# Patient Record
Sex: Female | Born: 2000 | Race: White | Hispanic: No | Marital: Single | Smoking: Former smoker
Health system: Southern US, Community
[De-identification: ages and names within clinical notes are randomized; demographics above are authoritative.]

## PROBLEM LIST (undated history)

## (undated) DIAGNOSIS — J020 Streptococcal pharyngitis: Secondary | ICD-10-CM

## (undated) DIAGNOSIS — F32A Depression, unspecified: Secondary | ICD-10-CM

## (undated) DIAGNOSIS — F329 Major depressive disorder, single episode, unspecified: Secondary | ICD-10-CM

## (undated) HISTORY — PX: ADENOIDECTOMY: SUR15

## (undated) HISTORY — PX: TONSILLECTOMY: SUR1361

## (undated) HISTORY — DX: Depression, unspecified: F32.A

---
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
## 2022-01-25 DIAGNOSIS — Z419 Encounter for procedure for purposes other than remedying health state, unspecified: Secondary | ICD-10-CM | POA: Diagnosis not present

## 2022-02-25 DIAGNOSIS — Z419 Encounter for procedure for purposes other than remedying health state, unspecified: Secondary | ICD-10-CM | POA: Diagnosis not present

## 2022-03-28 DIAGNOSIS — Z419 Encounter for procedure for purposes other than remedying health state, unspecified: Secondary | ICD-10-CM | POA: Diagnosis not present

## 2022-04-27 DIAGNOSIS — Z419 Encounter for procedure for purposes other than remedying health state, unspecified: Secondary | ICD-10-CM | POA: Diagnosis not present

## 2022-05-28 DIAGNOSIS — Z419 Encounter for procedure for purposes other than remedying health state, unspecified: Secondary | ICD-10-CM | POA: Diagnosis not present

## 2022-06-27 DIAGNOSIS — Z419 Encounter for procedure for purposes other than remedying health state, unspecified: Secondary | ICD-10-CM | POA: Diagnosis not present

## 2022-07-26 IMAGING — US US ABDOMEN LIMITED RUQ/ASCITES
1 series · 14 of 25 positions shown · non-contrast
Comparison: None.

CLINICAL DATA: Right upper quadrant pain with nausea and vomiting
x1 day.

EXAM:
ULTRASOUND ABDOMEN LIMITED RIGHT UPPER QUADRANT

[Series 1: us abdomen limited ruq (liver/gb) · 14 of 74 slices shown]
[im 1/74]
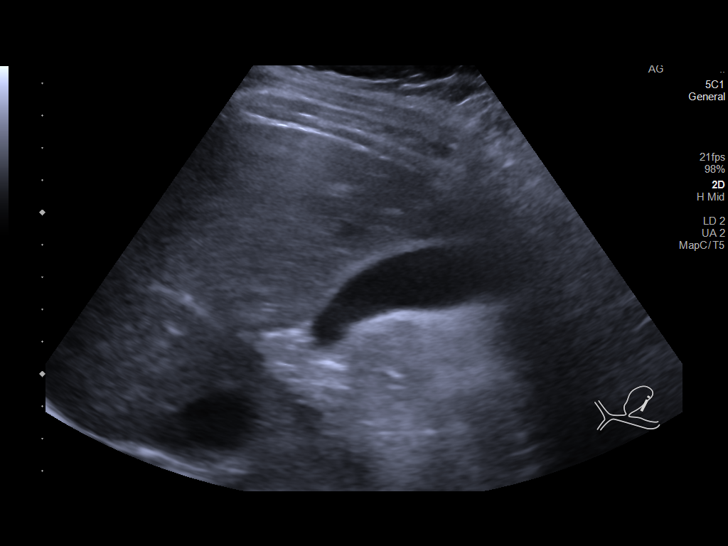
[im 7/74]
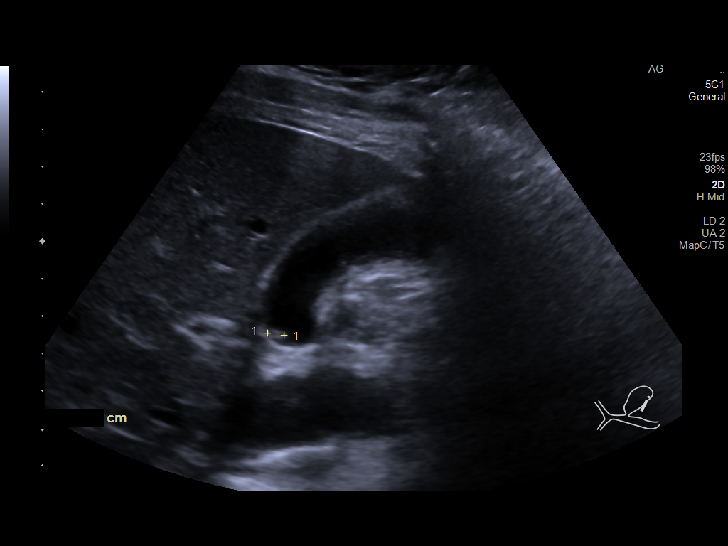
[im 13/74]
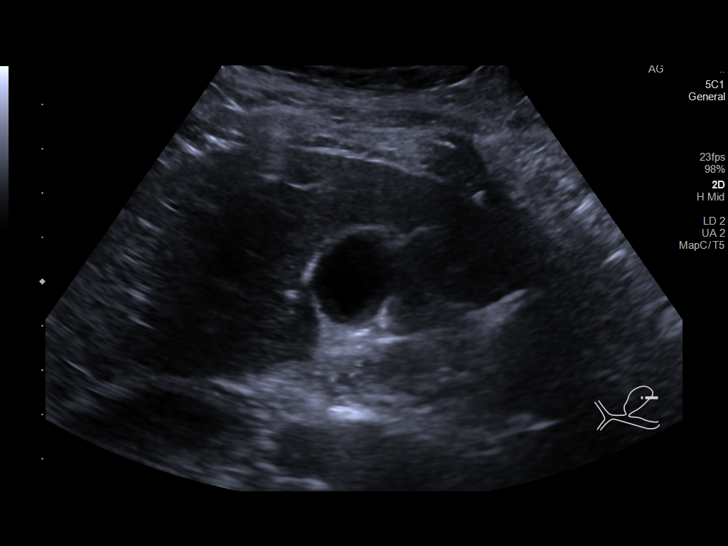
[im 19/74]
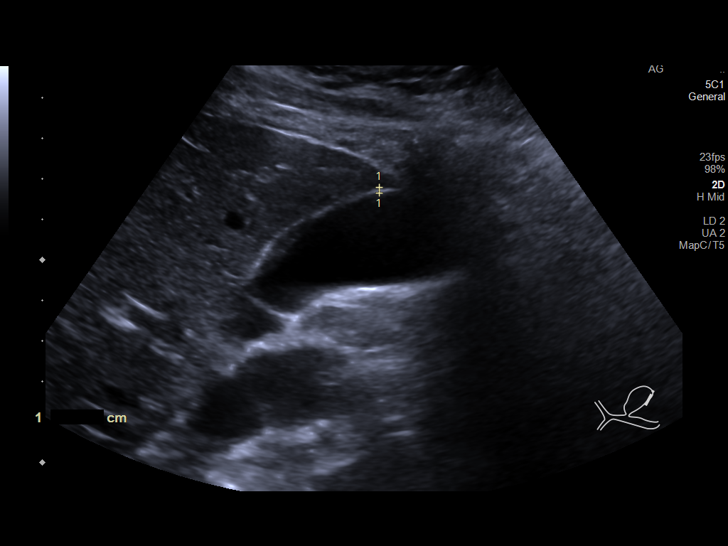
[im 25/74]
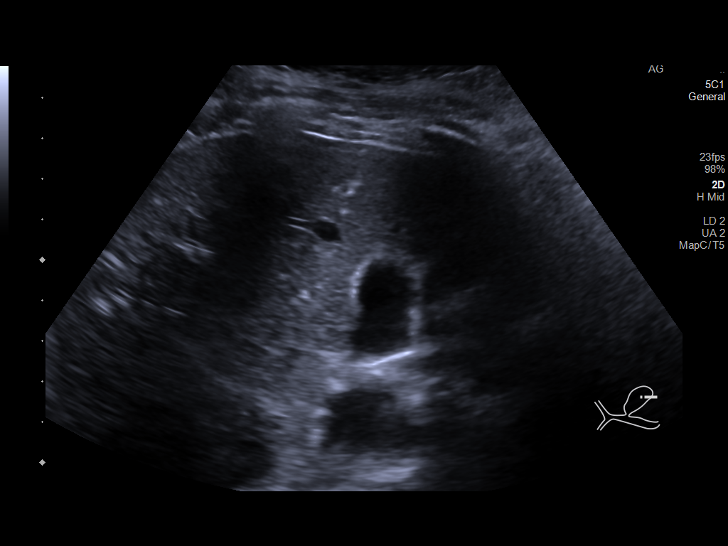
[im 28/74]
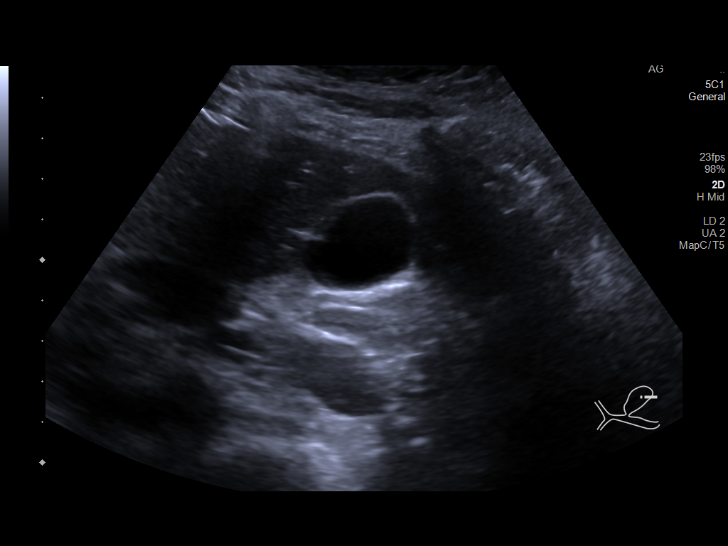
[im 34/74]
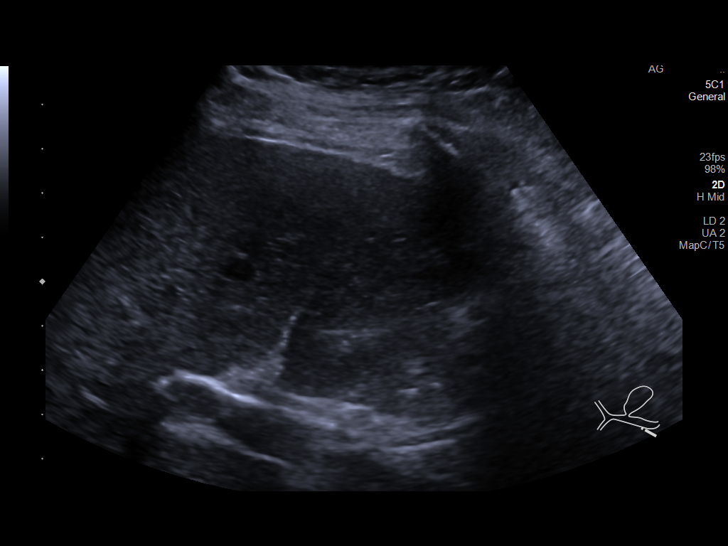
[im 40/74]
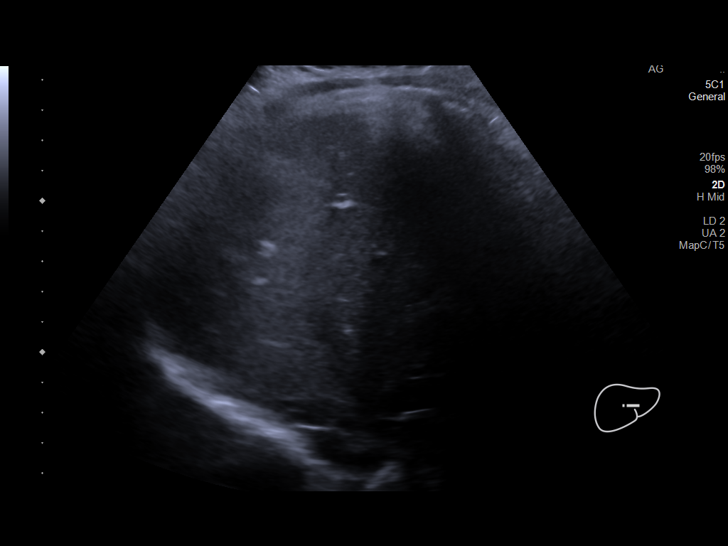
[im 46/74]
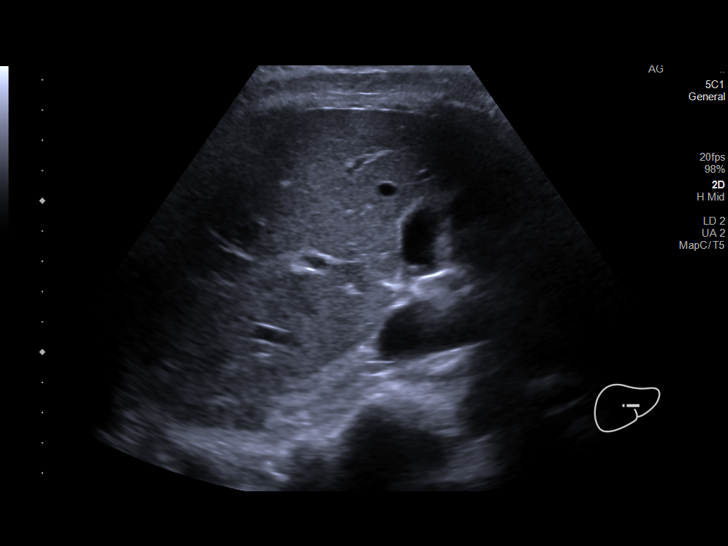
[im 49/74]
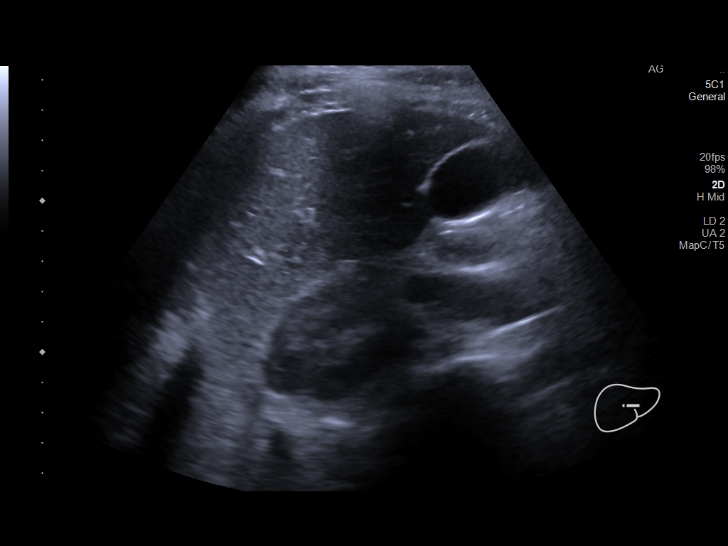
[im 55/74]
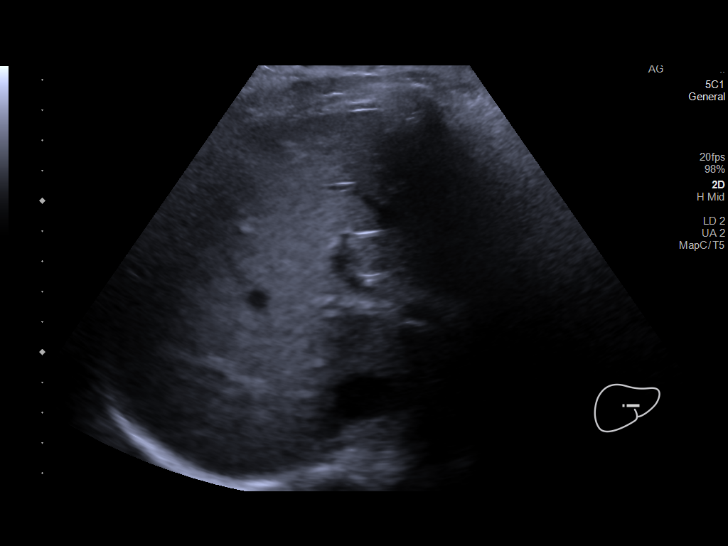
[im 61/74]
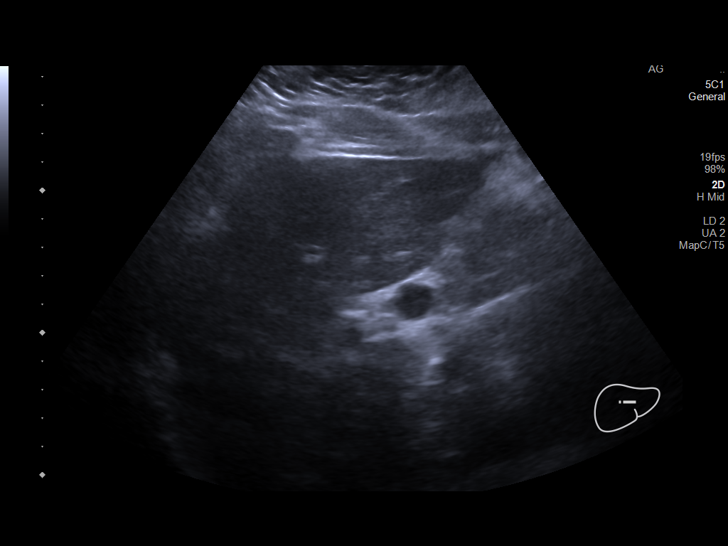
[im 67/74]
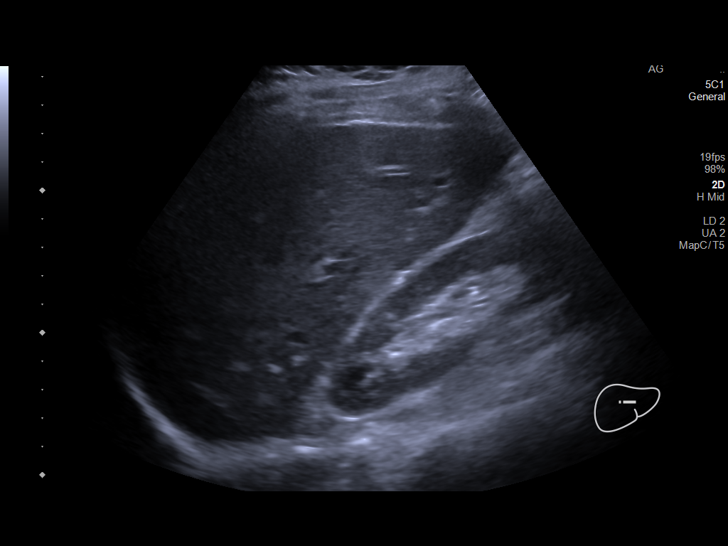
[im 74/74]
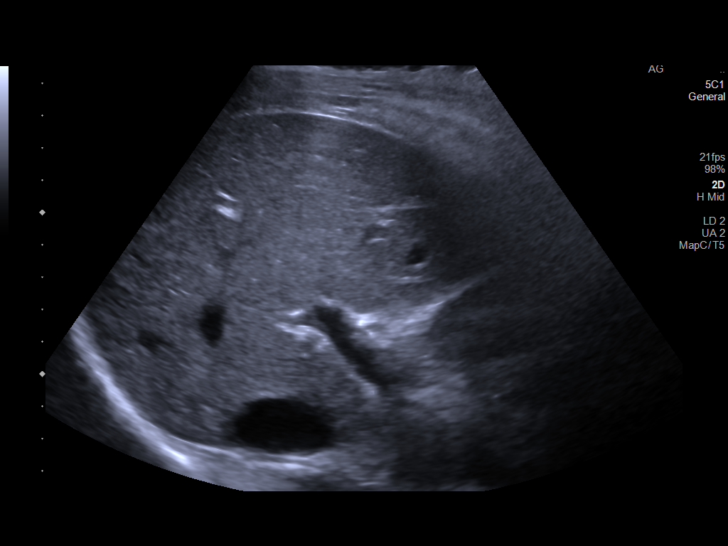

[14 of 25 positions shown; findings below may reference images not displayed]

FINDINGS: Gallbladder:

No gallstones or wall thickening visualized. No sonographic Murphy
sign noted by the sonographer.

-4 mm gallbladder polyp, no further evaluation or follow-up
necessary. Per consensus guidelines, this requires no additional
evaluation or specific follow-up. This recommendation follows ACR
consensus guidelines: White Paper of the ACR Incidental findings
Committee II on Gallbladder and Biliary Findings. [HOSPITAL]
2509:;[DATE].

Common bile duct:

Diameter: 2 mm

Liver:

No focal lesion identified. Within normal limits in parenchymal
echogenicity. Portal vein is patent on color Doppler imaging with
normal direction of blood flow towards the liver.

Other: None.
IMPRESSION: No acute sonographic abnormality in the right upper quadrant.

## 2022-07-28 DIAGNOSIS — Z419 Encounter for procedure for purposes other than remedying health state, unspecified: Secondary | ICD-10-CM | POA: Diagnosis not present

## 2022-08-28 DIAGNOSIS — Z419 Encounter for procedure for purposes other than remedying health state, unspecified: Secondary | ICD-10-CM | POA: Diagnosis not present

## 2022-09-26 DIAGNOSIS — Z419 Encounter for procedure for purposes other than remedying health state, unspecified: Secondary | ICD-10-CM | POA: Diagnosis not present

## 2022-10-27 DIAGNOSIS — Z419 Encounter for procedure for purposes other than remedying health state, unspecified: Secondary | ICD-10-CM | POA: Diagnosis not present

## 2022-11-26 DIAGNOSIS — Z419 Encounter for procedure for purposes other than remedying health state, unspecified: Secondary | ICD-10-CM | POA: Diagnosis not present

## 2022-12-27 DIAGNOSIS — Z419 Encounter for procedure for purposes other than remedying health state, unspecified: Secondary | ICD-10-CM | POA: Diagnosis not present

## 2023-01-26 DIAGNOSIS — Z419 Encounter for procedure for purposes other than remedying health state, unspecified: Secondary | ICD-10-CM | POA: Diagnosis not present

## 2023-02-26 DIAGNOSIS — Z419 Encounter for procedure for purposes other than remedying health state, unspecified: Secondary | ICD-10-CM | POA: Diagnosis not present

## 2023-03-29 DIAGNOSIS — Z419 Encounter for procedure for purposes other than remedying health state, unspecified: Secondary | ICD-10-CM | POA: Diagnosis not present

## 2023-04-28 DIAGNOSIS — Z419 Encounter for procedure for purposes other than remedying health state, unspecified: Secondary | ICD-10-CM | POA: Diagnosis not present

## 2023-05-29 DIAGNOSIS — Z419 Encounter for procedure for purposes other than remedying health state, unspecified: Secondary | ICD-10-CM | POA: Diagnosis not present

## 2023-06-10 DIAGNOSIS — R509 Fever, unspecified: Secondary | ICD-10-CM | POA: Diagnosis not present

## 2023-06-10 DIAGNOSIS — E860 Dehydration: Secondary | ICD-10-CM | POA: Diagnosis not present

## 2023-06-10 DIAGNOSIS — L03011 Cellulitis of right finger: Secondary | ICD-10-CM | POA: Diagnosis not present

## 2023-06-10 DIAGNOSIS — N390 Urinary tract infection, site not specified: Secondary | ICD-10-CM | POA: Diagnosis not present

## 2023-06-10 DIAGNOSIS — Z20822 Contact with and (suspected) exposure to covid-19: Secondary | ICD-10-CM | POA: Diagnosis not present

## 2023-06-28 DIAGNOSIS — Z419 Encounter for procedure for purposes other than remedying health state, unspecified: Secondary | ICD-10-CM | POA: Diagnosis not present

## 2023-07-29 DIAGNOSIS — Z419 Encounter for procedure for purposes other than remedying health state, unspecified: Secondary | ICD-10-CM | POA: Diagnosis not present

## 2023-08-27 NOTE — Progress Notes (Deleted)
 New Patient Office Visit  Subjective   Patient ID: Tanya Maldonado, female    DOB: 2001/02/23  Age: 23 y.o. MRN: [id number]  CC: No chief complaint on file.   HPI Tanya Maldonado presents to establish care ***  Outpatient Encounter Medications as of 08/31/2023  Medication Sig   norelgestromin-ethinyl estradiol (ORTHO EVRA) 150-35 MCG/24HR transdermal patch Place 1 patch onto the skin once a week.   ondansetron (ZOFRAN) 4 MG tablet Take 1 tablet (4 mg total) by mouth every 6 (six) hours.   prochlorperazine (COMPAZINE) 10 MG tablet Take 1 tablet (10 mg total) by mouth 2 (two) times daily as needed for nausea or vomiting.   No facility-administered encounter medications on file as of 08/31/2023.    Past Medical History:  Diagnosis Date   Depression    Strep pharyngitis     Past Surgical History:  Procedure Laterality Date   ADENOIDECTOMY     TONSILLECTOMY      Family History  Problem Relation Age of Onset   Heart attack Maternal Grandfather    ADD / ADHD Brother    Bipolar disorder Sister    Depression Sister     Social History   Socioeconomic History   Marital status: Single    Spouse name: Not on file   Number of children: Not on file   Years of education: Not on file   Highest education level: Not on file  Occupational History   Not on file  Tobacco Use   Smoking status: Former    Current packs/day: 0.00    Types: Cigarettes    Start date: 02/19/2019    Quit date: 12/08/2019    Years since quitting: 3.7   Smokeless tobacco: Never  Vaping Use   Vaping status: Former   Quit date: 01/06/2020   Substances: Nicotine  Substance and Sexual Activity   Alcohol use: Never   Drug use: Never   Sexual activity: Yes    Birth control/protection: Condom, None  Other Topics Concern   Not on file  Social History Narrative   Not on file   Social Drivers of Health   Financial Resource Strain: Not on file  Food Insecurity: Not on file  Transportation Needs: Not  on file  Physical Activity: Not on file  Stress: Not on file  Social Connections: Not on file  Intimate Partner Violence: Not on file    ROS Negative unless indicated in HPI    Objective   There were no vitals taken for this visit.  Physical Exam  Last CBC Lab Results  Component Value Date   WBC 10.4 11/20/2020   HGB 13.5 11/20/2020   HCT 39.5 11/20/2020   MCV 93.4 11/20/2020   MCH 31.9 11/20/2020   RDW 12.6 11/20/2020   PLT 392 11/20/2020   Last metabolic panel Lab Results  Component Value Date   GLUCOSE 135 (H) 11/20/2020   NA 132 (L) 11/20/2020   K 3.3 (L) 11/20/2020   CL 103 11/20/2020   CO2 16 (L) 11/20/2020   BUN 8 11/20/2020   CREATININE 0.78 11/20/2020   GFRNONAA NOT CALCULATED 11/20/2020   CALCIUM 9.6 11/20/2020   PROT 8.0 11/20/2020   ALBUMIN 4.8 11/20/2020   BILITOT 1.1 11/20/2020   ALKPHOS 49 11/20/2020   AST 32 11/20/2020   ALT 22 11/20/2020   ANIONGAP 13 11/20/2020         Assessment & Plan:  There are no diagnoses linked to this encounter. Continue healthy lifestyle choices,  including diet (rich in fruits, vegetables, and lean proteins, and low in salt and simple carbohydrates) and exercise (at least 30 minutes of moderate physical activity daily).     The above assessment and management plan was discussed with the patient. The patient verbalized understanding of and has agreed to the management plan. Patient is aware to call the clinic if they develop any new symptoms or if symptoms persist or worsen. Patient is aware when to return to the clinic for a follow-up visit. Patient educated on when it is appropriate to go to the emergency department.  No follow-ups on file.   Arrie Aran Santa Lighter, [facility] Medicine [address] [place], [place] [address] [phone]  Note: This document was prepared by [employer]ubin Milan voice dictation technology and any errors that results from this process are unintentional.

## 2023-08-29 DIAGNOSIS — Z419 Encounter for procedure for purposes other than remedying health state, unspecified: Secondary | ICD-10-CM | POA: Diagnosis not present

## 2023-08-31 ENCOUNTER — Ambulatory Visit: Payer: Medicaid Other | Admitting: Nurse Practitioner

## 2023-09-24 ENCOUNTER — Emergency Department (HOSPITAL_COMMUNITY)
Admission: EM | Admit: 2023-09-24 | Discharge: 2023-09-25 | Discharge status: Against Medical Advice | Payer: Medicaid Other | Attending: Emergency Medicine | Admitting: Emergency Medicine

## 2023-09-24 ENCOUNTER — Other Ambulatory Visit: Payer: Self-pay

## 2023-09-24 DIAGNOSIS — Z5321 Procedure and treatment not carried out due to patient leaving prior to being seen by health care provider: Secondary | ICD-10-CM | POA: Insufficient documentation

## 2023-09-24 DIAGNOSIS — T7421XA Adult sexual abuse, confirmed, initial encounter: Secondary | ICD-10-CM | POA: Diagnosis not present

## 2023-09-24 DIAGNOSIS — R109 Unspecified abdominal pain: Secondary | ICD-10-CM | POA: Diagnosis not present

## 2023-09-24 DIAGNOSIS — R112 Nausea with vomiting, unspecified: Secondary | ICD-10-CM | POA: Diagnosis not present

## 2023-09-24 DIAGNOSIS — Z743 Need for continuous supervision: Secondary | ICD-10-CM | POA: Diagnosis not present

## 2023-09-24 DIAGNOSIS — M549 Dorsalgia, unspecified: Secondary | ICD-10-CM | POA: Diagnosis not present

## 2023-09-24 DIAGNOSIS — R1030 Lower abdominal pain, unspecified: Secondary | ICD-10-CM | POA: Diagnosis not present

## 2023-09-24 DIAGNOSIS — R531 Weakness: Secondary | ICD-10-CM | POA: Diagnosis not present

## 2023-09-24 LAB — CBC WITH DIFFERENTIAL/PLATELET
Abs Immature Granulocytes: 0.09 10*3/uL — ABNORMAL HIGH (ref 0.00–0.07)
Basophils Absolute: 0 10*3/uL (ref 0.0–0.1)
Basophils Relative: 0 %
Eosinophils Absolute: 0 10*3/uL (ref 0.0–0.5)
Eosinophils Relative: 0 %
HCT: 44.6 % (ref 36.0–46.0)
Hemoglobin: 15 g/dL (ref 12.0–15.0)
Immature Granulocytes: 1 %
Lymphocytes Relative: 4 %
Lymphs Abs: 0.8 10*3/uL (ref 0.7–4.0)
MCH: 31.6 pg (ref 26.0–34.0)
MCHC: 33.6 g/dL (ref 30.0–36.0)
MCV: 94.1 fL (ref 80.0–100.0)
Monocytes Absolute: 0.6 10*3/uL (ref 0.1–1.0)
Monocytes Relative: 3 %
Neutro Abs: 17.7 10*3/uL — ABNORMAL HIGH (ref 1.7–7.7)
Neutrophils Relative %: 92 %
Platelets: 468 10*3/uL — ABNORMAL HIGH (ref 150–400)
RBC: 4.74 MIL/uL (ref 3.87–5.11)
RDW: 13.2 % (ref 11.5–15.5)
WBC: 19.1 10*3/uL — ABNORMAL HIGH (ref 4.0–10.5)
nRBC: 0 % (ref 0.0–0.2)

## 2023-09-24 LAB — URINALYSIS, ROUTINE W REFLEX MICROSCOPIC
Bilirubin Urine: NEGATIVE
Glucose, UA: NEGATIVE mg/dL
Hgb urine dipstick: NEGATIVE
Ketones, ur: NEGATIVE mg/dL
Leukocytes,Ua: NEGATIVE
Nitrite: NEGATIVE
Protein, ur: 30 mg/dL — AB
Specific Gravity, Urine: 1.025 (ref 1.005–1.030)
pH: 6 (ref 5.0–8.0)

## 2023-09-24 LAB — COMPREHENSIVE METABOLIC PANEL
ALT: 18 U/L (ref 0–44)
AST: 24 U/L (ref 15–41)
Albumin: 3.8 g/dL (ref 3.5–5.0)
Alkaline Phosphatase: 73 U/L (ref 38–126)
Anion gap: 12 (ref 5–15)
BUN: 13 mg/dL (ref 6–20)
CO2: 21 mmol/L — ABNORMAL LOW (ref 22–32)
Calcium: 9.1 mg/dL (ref 8.9–10.3)
Chloride: 101 mmol/L (ref 98–111)
Creatinine, Ser: 0.81 mg/dL (ref 0.44–1.00)
GFR, Estimated: >60 mL/min (ref >60)
Glucose, Bld: 151 mg/dL — ABNORMAL HIGH (ref 70–99)
Potassium: 4.2 mmol/L (ref 3.5–5.1)
Sodium: 134 mmol/L — ABNORMAL LOW (ref 135–145)
Total Bilirubin: 0.9 mg/dL (ref 0.0–1.2)
Total Protein: 7.5 g/dL (ref 6.5–8.1)

## 2023-09-24 LAB — PREGNANCY, URINE: Preg Test, Ur: NEGATIVE

## 2023-09-24 LAB — LIPASE, BLOOD: Lipase: 20 U/L (ref 11–51)
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
## 2023-09-24 NOTE — ED Notes (Signed)
 Mother called and checked on patient. Informed boyfriend was on his way here.

## 2023-09-25 LAB — GC/CHLAMYDIA PROBE AMP (~~LOC~~) NOT AT ARMC
Chlamydia: NEGATIVE
Comment: NEGATIVE
Comment: NORMAL
Neisseria Gonorrhea: NEGATIVE

## 2023-09-25 LAB — HIV ANTIBODY (ROUTINE TESTING W REFLEX): HIV Screen 4th Generation wRfx: NONREACTIVE

## 2023-09-26 DIAGNOSIS — Z419 Encounter for procedure for purposes other than remedying health state, unspecified: Secondary | ICD-10-CM | POA: Diagnosis not present

## 2023-11-07 DIAGNOSIS — Z419 Encounter for procedure for purposes other than remedying health state, unspecified: Secondary | ICD-10-CM | POA: Diagnosis not present

## 2023-12-07 DIAGNOSIS — Z419 Encounter for procedure for purposes other than remedying health state, unspecified: Secondary | ICD-10-CM | POA: Diagnosis not present

## 2024-01-07 DIAGNOSIS — Z419 Encounter for procedure for purposes other than remedying health state, unspecified: Secondary | ICD-10-CM | POA: Diagnosis not present

## 2024-02-06 ENCOUNTER — Other Ambulatory Visit: Payer: Self-pay

## 2024-02-06 ENCOUNTER — Encounter (HOSPITAL_COMMUNITY): Payer: Self-pay

## 2024-02-06 ENCOUNTER — Emergency Department (HOSPITAL_COMMUNITY)

## 2024-02-06 ENCOUNTER — Emergency Department (HOSPITAL_COMMUNITY)
Admission: EM | Admit: 2024-02-06 | Discharge: 2024-02-06 | Attending: Emergency Medicine | Admitting: Emergency Medicine

## 2024-02-06 DIAGNOSIS — Z419 Encounter for procedure for purposes other than remedying health state, unspecified: Secondary | ICD-10-CM | POA: Diagnosis not present

## 2024-02-06 DIAGNOSIS — Z91010 Allergy to peanuts: Secondary | ICD-10-CM | POA: Insufficient documentation

## 2024-02-06 DIAGNOSIS — F17210 Nicotine dependence, cigarettes, uncomplicated: Secondary | ICD-10-CM | POA: Insufficient documentation

## 2024-02-06 DIAGNOSIS — Z743 Need for continuous supervision: Secondary | ICD-10-CM | POA: Diagnosis not present

## 2024-02-06 DIAGNOSIS — R000 Tachycardia, unspecified: Secondary | ICD-10-CM | POA: Diagnosis not present

## 2024-02-06 DIAGNOSIS — T7840XA Allergy, unspecified, initial encounter: Secondary | ICD-10-CM | POA: Diagnosis not present

## 2024-02-06 LAB — URINALYSIS, ROUTINE W REFLEX MICROSCOPIC
Bilirubin Urine: NEGATIVE
Glucose, UA: NEGATIVE mg/dL
Hgb urine dipstick: NEGATIVE
Ketones, ur: NEGATIVE mg/dL
Nitrite: POSITIVE — AB
Protein, ur: NEGATIVE mg/dL
Specific Gravity, Urine: 1.006 (ref 1.005–1.030)
pH: 7 (ref 5.0–8.0)

## 2024-02-06 MED ORDER — FAMOTIDINE IN NACL 20-0.9 MG/50ML-% IV SOLN
20.0000 mg | Freq: Once | INTRAVENOUS | Status: AC
  Administered 2024-02-06: 20 mg via INTRAVENOUS
  Filled 2024-02-06: qty 50

## 2024-02-06 MED ORDER — CETIRIZINE HCL 10 MG PO TABS: 10.0000 mg | ORAL_TABLET | Freq: Every day | ORAL | 0 refills | Status: AC

## 2024-02-06 MED ORDER — DIPHENHYDRAMINE HCL 25 MG PO CAPS
50.0000 mg | ORAL_CAPSULE | Freq: Once | ORAL | Status: AC
  Administered 2024-02-06: 50 mg via ORAL
  Filled 2024-02-06: qty 2

## 2024-02-06 MED ORDER — EPINEPHRINE 0.3 MG/0.3ML IJ SOAJ: 0.3000 mg | INTRAMUSCULAR | 0 refills | Status: AC | PRN

## 2024-02-06 MED ORDER — SODIUM CHLORIDE 0.9 % IV BOLUS
1000.0000 mL | Freq: Once | INTRAVENOUS | Status: AC
  Administered 2024-02-06: 1000 mL via INTRAVENOUS

## 2024-02-06 MED ORDER — METHYLPREDNISOLONE SODIUM SUCC 125 MG IJ SOLR
125.0000 mg | Freq: Once | INTRAMUSCULAR | Status: AC
  Administered 2024-02-06: 125 mg via INTRAVENOUS
  Filled 2024-02-06: qty 2
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
## 2024-02-06 NOTE — Discharge Instructions (Addendum)
 It was a pleasure caring for you today in the emergency department.  Recommend you avoid known allergens in the future.  If you do experience symptoms of severe allergic reaction please administer EpiPen  and call EMS.  Take antihistamine daily for the next 2 weeks. Please follow up with your PCP  Please return to the emergency department for any worsening or worrisome symptoms.
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
## 2024-02-06 NOTE — ED Notes (Signed)
 X-ray at bedside

## 2024-02-06 NOTE — ED Notes (Signed)
 [facility] to notify us  that pt had taken 1000mg  Tylenol  and 20 mg Omeprazole this am before coming to ED along with the Epipen .
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
## 2024-02-10 ENCOUNTER — Emergency Department (HOSPITAL_COMMUNITY)
Admission: EM | Admit: 2024-02-10 | Discharge: 2024-02-10 | Discharge status: Against Medical Advice | Attending: Student | Admitting: Student

## 2024-02-10 ENCOUNTER — Other Ambulatory Visit: Payer: Self-pay

## 2024-02-10 ENCOUNTER — Encounter (HOSPITAL_COMMUNITY): Payer: Self-pay | Admitting: *Deleted

## 2024-02-10 DIAGNOSIS — Z91010 Allergy to peanuts: Secondary | ICD-10-CM | POA: Diagnosis not present

## 2024-02-10 DIAGNOSIS — Z743 Need for continuous supervision: Secondary | ICD-10-CM | POA: Diagnosis not present

## 2024-02-10 DIAGNOSIS — R110 Nausea: Secondary | ICD-10-CM | POA: Diagnosis not present

## 2024-02-10 DIAGNOSIS — L03111 Cellulitis of right axilla: Secondary | ICD-10-CM

## 2024-02-10 DIAGNOSIS — T7840XA Allergy, unspecified, initial encounter: Secondary | ICD-10-CM | POA: Insufficient documentation

## 2024-02-10 DIAGNOSIS — R609 Edema, unspecified: Secondary | ICD-10-CM | POA: Diagnosis not present

## 2024-02-10 DIAGNOSIS — R000 Tachycardia, unspecified: Secondary | ICD-10-CM | POA: Diagnosis not present

## 2024-02-10 LAB — CBC WITH DIFFERENTIAL/PLATELET
Abs Immature Granulocytes: 0.03 K/uL (ref 0.00–0.07)
Basophils Absolute: 0 K/uL (ref 0.0–0.1)
Basophils Relative: 0 %
Eosinophils Absolute: 0.1 K/uL (ref 0.0–0.5)
Eosinophils Relative: 1 %
HCT: 41 % (ref 36.0–46.0)
Hemoglobin: 13.3 g/dL (ref 12.0–15.0)
Immature Granulocytes: 0 %
Lymphocytes Relative: 17 %
Lymphs Abs: 1.6 K/uL (ref 0.7–4.0)
MCH: 30.8 pg (ref 26.0–34.0)
MCHC: 32.4 g/dL (ref 30.0–36.0)
MCV: 94.9 fL (ref 80.0–100.0)
Monocytes Absolute: 0.3 K/uL (ref 0.1–1.0)
Monocytes Relative: 3 %
Neutro Abs: 7.3 K/uL (ref 1.7–7.7)
Neutrophils Relative %: 79 %
Platelets: 364 K/uL (ref 150–400)
RBC: 4.32 MIL/uL (ref 3.87–5.11)
RDW: 12.2 % (ref 11.5–15.5)
WBC: 9.4 K/uL (ref 4.0–10.5)
nRBC: 0 % (ref 0.0–0.2)

## 2024-02-10 LAB — COMPREHENSIVE METABOLIC PANEL WITH GFR
ALT: 15 U/L (ref 0–44)
AST: 11 U/L — ABNORMAL LOW (ref 15–41)
Albumin: 3.5 g/dL (ref 3.5–5.0)
Alkaline Phosphatase: 71 U/L (ref 38–126)
Anion gap: 9 (ref 5–15)
BUN: 13 mg/dL (ref 6–20)
CO2: 23 mmol/L (ref 22–32)
Calcium: 8.3 mg/dL — ABNORMAL LOW (ref 8.9–10.3)
Chloride: 107 mmol/L (ref 98–111)
Creatinine, Ser: 0.67 mg/dL (ref 0.44–1.00)
GFR, Estimated: >60 mL/min (ref >60)
Glucose, Bld: 107 mg/dL — ABNORMAL HIGH (ref 70–99)
Potassium: 4 mmol/L (ref 3.5–5.1)
Sodium: 139 mmol/L (ref 135–145)
Total Bilirubin: 0.3 mg/dL (ref 0.0–1.2)
Total Protein: 6.5 g/dL (ref 6.5–8.1)

## 2024-02-10 LAB — URINALYSIS, ROUTINE W REFLEX MICROSCOPIC
Bilirubin Urine: NEGATIVE
Glucose, UA: NEGATIVE mg/dL
Hgb urine dipstick: NEGATIVE
Ketones, ur: NEGATIVE mg/dL
Nitrite: NEGATIVE
Protein, ur: NEGATIVE mg/dL
Specific Gravity, Urine: 1.008 (ref 1.005–1.030)
pH: 7 (ref 5.0–8.0)

## 2024-02-10 LAB — PREGNANCY, URINE: Preg Test, Ur: NEGATIVE

## 2024-02-10 LAB — LIPASE, BLOOD: Lipase: 34 U/L (ref 11–51)

## 2024-02-10 MED ORDER — CEPHALEXIN 500 MG PO CAPS
500.0000 mg | ORAL_CAPSULE | Freq: Four times a day (QID) | ORAL | 0 refills | Status: AC
Start: 2024-02-10 — End: 2024-02-20

## 2024-02-10 MED ORDER — DOXYCYCLINE HYCLATE 100 MG PO TABS
100.0000 mg | ORAL_TABLET | Freq: Once | ORAL | Status: AC
  Administered 2024-02-10: 100 mg via ORAL
  Filled 2024-02-10: qty 1

## 2024-02-10 MED ORDER — DOXYCYCLINE HYCLATE 100 MG PO CAPS: 100.0000 mg | ORAL_CAPSULE | Freq: Two times a day (BID) | ORAL | 0 refills | Status: DC

## 2024-02-10 MED ORDER — FAMOTIDINE IN NACL 20-0.9 MG/50ML-% IV SOLN
20.0000 mg | Freq: Once | INTRAVENOUS | Status: AC
  Administered 2024-02-10: 20 mg via INTRAVENOUS
  Filled 2024-02-10: qty 50

## 2024-02-10 MED ORDER — SODIUM CHLORIDE 0.9 % IV BOLUS
1000.0000 mL | Freq: Once | INTRAVENOUS | Status: AC
  Administered 2024-02-10: 1000 mL via INTRAVENOUS

## 2024-02-10 MED ORDER — KETOROLAC TROMETHAMINE 15 MG/ML IJ SOLN
15.0000 mg | Freq: Once | INTRAMUSCULAR | Status: AC
  Administered 2024-02-10: 15 mg via INTRAVENOUS
  Filled 2024-02-10: qty 1

## 2024-02-10 NOTE — ED Notes (Addendum)
 Staff member removed pt's IV, reported that pt left.  I had explained to pt earlier that she had not been discharged yet( pt was just released from custody with [facility]) .  EDP informed of pt's leaving.

## 2024-02-10 NOTE — ED Triage Notes (Addendum)
 Pt BIB [facility] for allergic reaction, pt ate some peanut butter today. Pt is allergic to peanuts.  Reported that pt did the same on Saturday.  Pt is inmate at [facility] jail with guards at bedside. Reported that peanut butter was found in her room.  Reported that epi was given at the jail, EMS est IV and benadryl  50mg , solumedrol 125 mg and zofran  given en route.   EPI given at 1055 to right thigh.

## 2024-02-10 NOTE — ED Provider Notes (Signed)
 [place] EMERGENCY DEPARTMENT AT [facility] Provider Note   CSN: [id number] Arrival date & time: 02/10/24  1138     Patient presents with: Allergic Reaction   Tanya Maldonado is a 23 y.o. female.   Patient is a 23 year old female who is currently incarcerated who presents emergency department with a chief complaint of allergic reaction.  She notes that she was exposed to peanuts which she is allergic to.  She notes that she began developing shortness of breath, nausea and swelling.  She was given epinephrine  as well as 50 of Benadryl , 125 Solu-Medrol  and Zofran  and route.  She notes that she is feeling improved at this time.  Patient notes that she has been experiencing chronic ongoing abdominal pain as well as associated constipation.  She denies any fever, chills, chest pain, shortness of breath.  She also notes that she does have an area of swelling just under the right axilla.   Allergic Reaction Presenting symptoms: rash        Prior to Admission medications   Medication Sig Start Date End Date Taking? Authorizing Provider  cetirizine  (ZYRTEC  ALLERGY) 10 MG tablet Take 1 tablet (10 mg total) by mouth daily for 14 days. 02/06/24 02/20/24  Elnor Savant A, DO  EPINEPHrine  0.3 mg/0.3 mL IJ SOAJ injection Inject 0.3 mg into the muscle as needed for anaphylaxis. 02/06/24   Elnor Savant LABOR, DO  GOODSENSE PAIN RELIEF EXTRA ST 500 MG tablet Take 500 mg by mouth every 6 (six) hours as needed for mild pain (pain score 1-3). 02/01/24   [provider]  norelgestromin -ethinyl estradiol  (ORTHO EVRA) 150-35 MCG/24HR transdermal patch Place 1 patch onto the skin once a week. 01/17/19   Signa Delon LABOR, NP  omeprazole (PRILOSEC) 40 MG capsule Take 40 mg by mouth daily. 08/13/21   [provider]  ondansetron  (ZOFRAN ) 4 MG tablet Take 1 tablet (4 mg total) by mouth every 6 (six) hours. 01/07/20   Cleotilde Rogue, MD  prochlorperazine  (COMPAZINE ) 10 MG tablet Take 1 tablet  (10 mg total) by mouth 2 (two) times daily as needed for nausea or vomiting. 11/20/20   Idol, Julie, PA-C    Allergies: Other and Peanut-containing drug products    Review of Systems  Gastrointestinal:  Positive for abdominal pain.  Skin:  Positive for rash.  All other systems reviewed and are negative.   Updated Vital Signs BP 123/81   Pulse 95   Temp 98.2 F (36.8 C) (Oral)   Resp (!) 23   Ht 5' 3 (1.6 m)   Wt 61.7 kg   SpO2 100%   BMI 24.10 kg/m   Physical Exam Vitals and nursing note reviewed.  Constitutional:      General: She is not in acute distress.    Appearance: Normal appearance. She is not ill-appearing.  HENT:     Head: Normocephalic and atraumatic.     Nose: Nose normal.     Mouth/Throat:     Mouth: Mucous membranes are moist.  Eyes:     Extraocular Movements: Extraocular movements intact.     Conjunctiva/sclera: Conjunctivae normal.     Pupils: Pupils are equal, round, and reactive to light.  Cardiovascular:     Rate and Rhythm: Normal rate and regular rhythm.     Pulses: Normal pulses.     Heart sounds: Normal heart sounds. No murmur heard.    No gallop.  Pulmonary:     Effort: Pulmonary effort is normal. No respiratory distress.  Breath sounds: Normal breath sounds. No stridor. No wheezing, rhonchi or rales.  Abdominal:     General: Abdomen is flat. Bowel sounds are normal. There is no distension.     Palpations: Abdomen is soft.     Tenderness: There is abdominal tenderness. There is no guarding.  Musculoskeletal:        General: Normal range of motion.     Cervical back: Normal range of motion and neck supple. No rigidity or tenderness.     Right lower leg: No edema.     Left lower leg: No edema.  Skin:    General: Skin is warm and dry.     Comments: Localized area of swelling to the right axilla, no areas of fluctuance, no active discharge, no lymphatic streaking  Neurological:     General: No focal deficit present.     Mental Status:  She is alert and oriented to person, place, and time. Mental status is at baseline.  Psychiatric:        Mood and Affect: Mood normal.        Behavior: Behavior normal.        Thought Content: Thought content normal.        Judgment: Judgment normal.     (all labs ordered are listed, but only abnormal results are displayed) Labs Reviewed  COMPREHENSIVE METABOLIC PANEL WITH GFR  LIPASE, BLOOD  CBC WITH DIFFERENTIAL/PLATELET  URINALYSIS, ROUTINE W REFLEX MICROSCOPIC  PREGNANCY, URINE    EKG: None  Radiology: No results found.   Procedures   Medications Ordered in the ED  famotidine  (PEPCID ) IVPB 20 mg premix (20 mg Intravenous New Bag/Given 02/10/24 1218)  ketorolac  (TORADOL ) 15 MG/ML injection 15 mg (has no administration in time range)  doxycycline  (VIBRA -TABS) tablet 100 mg (has no administration in time range)  sodium chloride  0.9 % bolus 1,000 mL (1,000 mLs Intravenous New Bag/Given 02/10/24 1217)                                    Medical Decision Making Patient did leave the emergency department before all workup was finished.  I was unable to reevaluate the patient before that time.  She was otherwise well-appearing before leaving with no signs of anaphylaxis or acute allergic process.  Urinalysis is concerning for urinary tract infection and she did have an area of possible cellulitis to her right axilla.  Will still call in antibiotics.  Patient was released from custody and then left the emergency department shortly thereafter.  Again patient was stable on my initial and secondary evaluation.  Unable to discuss the risks with the patient regarding leaving AGAINST MEDICAL ADVICE.  Unable to contact the patient.  Amount and/or Complexity of Data Reviewed Labs: ordered.  Risk Prescription drug management.        Final diagnoses:  None    ED Discharge Orders     None          Daralene Lonni JONETTA DEVONNA 02/10/24 1425    Kommor, [place],  MD 02/11/24 [phone]

## 2024-02-26 DIAGNOSIS — F17210 Nicotine dependence, cigarettes, uncomplicated: Secondary | ICD-10-CM | POA: Diagnosis not present

## 2024-02-26 DIAGNOSIS — W268XXA Contact with other sharp object(s), not elsewhere classified, initial encounter: Secondary | ICD-10-CM | POA: Diagnosis not present

## 2024-02-26 DIAGNOSIS — L03012 Cellulitis of left finger: Secondary | ICD-10-CM | POA: Diagnosis not present

## 2024-02-26 DIAGNOSIS — M7989 Other specified soft tissue disorders: Secondary | ICD-10-CM | POA: Diagnosis not present

## 2024-03-08 DIAGNOSIS — Z419 Encounter for procedure for purposes other than remedying health state, unspecified: Secondary | ICD-10-CM | POA: Diagnosis not present

## 2024-04-08 DIAGNOSIS — Z419 Encounter for procedure for purposes other than remedying health state, unspecified: Secondary | ICD-10-CM | POA: Diagnosis not present

## 2024-06-04 ENCOUNTER — Emergency Department (HOSPITAL_COMMUNITY)

## 2024-06-04 ENCOUNTER — Other Ambulatory Visit: Payer: Self-pay

## 2024-06-04 ENCOUNTER — Inpatient Hospital Stay (HOSPITAL_COMMUNITY)
Admission: EM | Admit: 2024-06-04 | Discharge: 2024-06-09 | DRG: 897 | Attending: Internal Medicine | Admitting: Internal Medicine

## 2024-06-04 ENCOUNTER — Encounter (HOSPITAL_COMMUNITY): Payer: Self-pay | Admitting: Emergency Medicine

## 2024-06-04 DIAGNOSIS — F1010 Alcohol abuse, uncomplicated: Secondary | ICD-10-CM | POA: Diagnosis not present

## 2024-06-04 DIAGNOSIS — Z889 Allergy status to unspecified drugs, medicaments and biological substances status: Secondary | ICD-10-CM

## 2024-06-04 DIAGNOSIS — R000 Tachycardia, unspecified: Secondary | ICD-10-CM | POA: Diagnosis not present

## 2024-06-04 DIAGNOSIS — G9389 Other specified disorders of brain: Secondary | ICD-10-CM | POA: Diagnosis present

## 2024-06-04 DIAGNOSIS — Z91010 Allergy to peanuts: Secondary | ICD-10-CM

## 2024-06-04 DIAGNOSIS — Z793 Long term (current) use of hormonal contraceptives: Secondary | ICD-10-CM

## 2024-06-04 DIAGNOSIS — Z91018 Allergy to other foods: Secondary | ICD-10-CM

## 2024-06-04 DIAGNOSIS — Z87892 Personal history of anaphylaxis: Secondary | ICD-10-CM

## 2024-06-04 DIAGNOSIS — Z8249 Family history of ischemic heart disease and other diseases of the circulatory system: Secondary | ICD-10-CM

## 2024-06-04 DIAGNOSIS — Z818 Family history of other mental and behavioral disorders: Secondary | ICD-10-CM

## 2024-06-04 DIAGNOSIS — F1510 Other stimulant abuse, uncomplicated: Secondary | ICD-10-CM | POA: Diagnosis present

## 2024-06-04 DIAGNOSIS — F1110 Opioid abuse, uncomplicated: Secondary | ICD-10-CM | POA: Diagnosis present

## 2024-06-04 DIAGNOSIS — R569 Unspecified convulsions: Principal | ICD-10-CM

## 2024-06-04 DIAGNOSIS — F1910 Other psychoactive substance abuse, uncomplicated: Secondary | ICD-10-CM | POA: Diagnosis present

## 2024-06-04 DIAGNOSIS — T7621XA Adult sexual abuse, suspected, initial encounter: Secondary | ICD-10-CM | POA: Diagnosis present

## 2024-06-04 DIAGNOSIS — R112 Nausea with vomiting, unspecified: Secondary | ICD-10-CM | POA: Diagnosis not present

## 2024-06-04 DIAGNOSIS — F1990 Other psychoactive substance use, unspecified, uncomplicated: Secondary | ICD-10-CM

## 2024-06-04 DIAGNOSIS — Z7151 Drug abuse counseling and surveillance of drug abuser: Secondary | ICD-10-CM

## 2024-06-04 DIAGNOSIS — F10939 Alcohol use, unspecified with withdrawal, unspecified: Secondary | ICD-10-CM

## 2024-06-04 DIAGNOSIS — F17210 Nicotine dependence, cigarettes, uncomplicated: Secondary | ICD-10-CM | POA: Diagnosis present

## 2024-06-04 DIAGNOSIS — F10139 Alcohol abuse with withdrawal, unspecified: Secondary | ICD-10-CM | POA: Diagnosis not present

## 2024-06-04 DIAGNOSIS — Z79899 Other long term (current) drug therapy: Secondary | ICD-10-CM

## 2024-06-04 DIAGNOSIS — F10930 Alcohol use, unspecified with withdrawal, uncomplicated: Principal | ICD-10-CM

## 2024-06-04 LAB — URINE DRUG SCREEN
Amphetamines: POSITIVE — AB
Barbiturates: NEGATIVE
Benzodiazepines: NEGATIVE
Cocaine: NEGATIVE
Fentanyl: POSITIVE — AB
Methadone Scn, Ur: NEGATIVE
Opiates: NEGATIVE
Tetrahydrocannabinol: POSITIVE — AB

## 2024-06-04 LAB — HEPATIC FUNCTION PANEL
ALT: 10 U/L (ref 0–44)
AST: 12 U/L — ABNORMAL LOW (ref 15–41)
Albumin: 4 g/dL (ref 3.5–5.0)
Alkaline Phosphatase: 66 U/L (ref 38–126)
Bilirubin, Direct: 0.2 mg/dL (ref 0.0–0.2)
Indirect Bilirubin: 0.2 mg/dL — ABNORMAL LOW (ref 0.3–0.9)
Total Bilirubin: 0.4 mg/dL (ref 0.0–1.2)
Total Protein: 6.5 g/dL (ref 6.5–8.1)

## 2024-06-04 LAB — CBC WITH DIFFERENTIAL/PLATELET
Abs Immature Granulocytes: 0.01 K/uL (ref 0.00–0.07)
Basophils Absolute: 0 K/uL (ref 0.0–0.1)
Basophils Relative: 1 %
Eosinophils Absolute: 0.1 K/uL (ref 0.0–0.5)
Eosinophils Relative: 1 %
HCT: 41.3 % (ref 36.0–46.0)
Hemoglobin: 13.8 g/dL (ref 12.0–15.0)
Immature Granulocytes: 0 %
Lymphocytes Relative: 27 %
Lymphs Abs: 1.8 K/uL (ref 0.7–4.0)
MCH: 31.3 pg (ref 26.0–34.0)
MCHC: 33.4 g/dL (ref 30.0–36.0)
MCV: 93.7 fL (ref 80.0–100.0)
Monocytes Absolute: 0.5 K/uL (ref 0.1–1.0)
Monocytes Relative: 7 %
Neutro Abs: 4.2 K/uL (ref 1.7–7.7)
Neutrophils Relative %: 64 %
Platelets: 397 K/uL (ref 150–400)
RBC: 4.41 MIL/uL (ref 3.87–5.11)
RDW: 12.9 % (ref 11.5–15.5)
WBC: 6.6 K/uL (ref 4.0–10.5)
nRBC: 0 % (ref 0.0–0.2)

## 2024-06-04 LAB — URINALYSIS, ROUTINE W REFLEX MICROSCOPIC
Bacteria, UA: NONE SEEN
Bilirubin Urine: NEGATIVE
Glucose, UA: NEGATIVE mg/dL
Hgb urine dipstick: NEGATIVE
Ketones, ur: NEGATIVE mg/dL
Leukocytes,Ua: NEGATIVE
Nitrite: POSITIVE — AB
Protein, ur: NEGATIVE mg/dL
Specific Gravity, Urine: 1.018 (ref 1.005–1.030)
pH: 6 (ref 5.0–8.0)

## 2024-06-04 LAB — BASIC METABOLIC PANEL WITH GFR
Anion gap: 9 (ref 5–15)
BUN: 8 mg/dL (ref 6–20)
CO2: 23 mmol/L (ref 22–32)
Calcium: 8.4 mg/dL — ABNORMAL LOW (ref 8.9–10.3)
Chloride: 106 mmol/L (ref 98–111)
Creatinine, Ser: 0.69 mg/dL (ref 0.44–1.00)
GFR, Estimated: >60 mL/min (ref >60)
Glucose, Bld: 88 mg/dL (ref 70–99)
Potassium: 3.6 mmol/L (ref 3.5–5.1)
Sodium: 139 mmol/L (ref 135–145)

## 2024-06-04 LAB — LIPASE, BLOOD: Lipase: 33 U/L (ref 11–51)

## 2024-06-04 LAB — ETHANOL: Alcohol, Ethyl (B): <15 mg/dL (ref <15)

## 2024-06-04 LAB — PREGNANCY, URINE: Preg Test, Ur: NEGATIVE

## 2024-06-04 MED ORDER — ACETAMINOPHEN 650 MG RE SUPP: 650.0000 mg | Freq: Four times a day (QID) | RECTAL | Status: DC | PRN

## 2024-06-04 MED ORDER — BISACODYL 5 MG PO TBEC: 5.0000 mg | DELAYED_RELEASE_TABLET | Freq: Every day | ORAL | Status: DC | PRN

## 2024-06-04 MED ORDER — ONDANSETRON HCL 4 MG PO TABS: 4.0000 mg | ORAL_TABLET | Freq: Four times a day (QID) | ORAL | Status: DC | PRN

## 2024-06-04 MED ORDER — PROCHLORPERAZINE EDISYLATE 10 MG/2ML IJ SOLN
10.0000 mg | INTRAMUSCULAR | Status: DC | PRN
  Administered 2024-06-07 – 2024-06-08 (×2): 10 mg via INTRAVENOUS
  Filled 2024-06-04 (×2): qty 2

## 2024-06-04 MED ORDER — NICOTINE 21 MG/24HR TD PT24: 21.0000 mg | MEDICATED_PATCH | Freq: Every day | TRANSDERMAL | Status: DC | PRN

## 2024-06-04 MED ORDER — TRAZODONE HCL 50 MG PO TABS: 25.0000 mg | ORAL_TABLET | Freq: Every evening | ORAL | Status: DC | PRN

## 2024-06-04 MED ORDER — THIAMINE HCL 100 MG/ML IJ SOLN: 100.0000 mg | Freq: Every day | INTRAMUSCULAR | Status: DC

## 2024-06-04 MED ORDER — ONDANSETRON HCL 4 MG/2ML IJ SOLN
4.0000 mg | Freq: Four times a day (QID) | INTRAMUSCULAR | Status: DC | PRN
Start: 2024-06-04 — End: 2024-06-09
  Administered 2024-06-07: 4 mg via INTRAVENOUS
  Filled 2024-06-04: qty 2

## 2024-06-04 MED ORDER — CHLORDIAZEPOXIDE HCL 25 MG PO CAPS
25.0000 mg | ORAL_CAPSULE | Freq: Three times a day (TID) | ORAL | Status: AC
  Administered 2024-06-04 – 2024-06-05 (×6): 25 mg via ORAL
  Filled 2024-06-04 (×6): qty 1

## 2024-06-04 MED ORDER — ENOXAPARIN SODIUM 40 MG/0.4ML IJ SOSY
40.0000 mg | PREFILLED_SYRINGE | INTRAMUSCULAR | Status: DC
  Administered 2024-06-04 – 2024-06-08 (×5): 40 mg via SUBCUTANEOUS
  Filled 2024-06-04 (×5): qty 0.4

## 2024-06-04 MED ORDER — KETOROLAC TROMETHAMINE 30 MG/ML IJ SOLN
30.0000 mg | Freq: Four times a day (QID) | INTRAMUSCULAR | Status: DC | PRN
  Administered 2024-06-05: 30 mg via INTRAVENOUS
  Filled 2024-06-04: qty 1

## 2024-06-04 MED ORDER — THIAMINE MONONITRATE 100 MG PO TABS
100.0000 mg | ORAL_TABLET | Freq: Every day | ORAL | Status: DC
  Administered 2024-06-04 – 2024-06-09 (×6): 100 mg via ORAL
  Filled 2024-06-04 (×6): qty 1

## 2024-06-04 MED ORDER — FOLIC ACID 1 MG PO TABS
1.0000 mg | ORAL_TABLET | Freq: Every day | ORAL | Status: DC
  Administered 2024-06-04 – 2024-06-09 (×6): 1 mg via ORAL
  Filled 2024-06-04 (×6): qty 1

## 2024-06-04 MED ORDER — LORAZEPAM 2 MG/ML IJ SOLN
1.0000 mg | INTRAMUSCULAR | Status: AC | PRN
  Filled 2024-06-04: qty 1

## 2024-06-04 MED ORDER — LACTATED RINGERS IV SOLN: INTRAVENOUS | Status: AC

## 2024-06-04 MED ORDER — LORAZEPAM 1 MG PO TABS
1.0000 mg | ORAL_TABLET | ORAL | Status: AC | PRN
  Administered 2024-06-05 – 2024-06-06 (×2): 1 mg via ORAL
  Filled 2024-06-04 (×2): qty 1

## 2024-06-04 MED ORDER — OXYCODONE HCL 5 MG PO TABS
5.0000 mg | ORAL_TABLET | Freq: Four times a day (QID) | ORAL | Status: DC | PRN
  Administered 2024-06-06: 5 mg via ORAL
  Filled 2024-06-04: qty 1

## 2024-06-04 MED ORDER — CALCIUM GLUCONATE-NACL 1-0.675 GM/50ML-% IV SOLN
1.0000 g | Freq: Once | INTRAVENOUS | Status: AC
  Administered 2024-06-04: 1000 mg via INTRAVENOUS
  Filled 2024-06-04: qty 50

## 2024-06-04 MED ORDER — ADULT MULTIVITAMIN W/MINERALS CH
1.0000 | ORAL_TABLET | Freq: Every day | ORAL | Status: DC
  Administered 2024-06-04 – 2024-06-09 (×6): 1 via ORAL
  Filled 2024-06-04 (×6): qty 1

## 2024-06-04 MED ORDER — IPRATROPIUM-ALBUTEROL 0.5-2.5 (3) MG/3ML IN SOLN: 3.0000 mL | RESPIRATORY_TRACT | Status: DC | PRN

## 2024-06-04 MED ORDER — PANTOPRAZOLE SODIUM 40 MG PO TBEC
40.0000 mg | DELAYED_RELEASE_TABLET | Freq: Every evening | ORAL | Status: DC
  Administered 2024-06-04 – 2024-06-08 (×5): 40 mg via ORAL
  Filled 2024-06-04 (×6): qty 1

## 2024-06-04 MED ORDER — ACETAMINOPHEN 325 MG PO TABS
650.0000 mg | ORAL_TABLET | Freq: Four times a day (QID) | ORAL | Status: DC | PRN
  Administered 2024-06-06 – 2024-06-08 (×2): 650 mg via ORAL
  Filled 2024-06-04 (×2): qty 2

## 2024-06-04 MED ORDER — LORAZEPAM 2 MG/ML IJ SOLN
1.0000 mg | INTRAMUSCULAR | Status: DC | PRN
  Administered 2024-06-04 – 2024-06-07 (×5): 1 mg via INTRAVENOUS
  Filled 2024-06-04 (×4): qty 1

## 2024-06-04 MED ORDER — CHLORDIAZEPOXIDE HCL 5 MG PO CAPS
5.0000 mg | ORAL_CAPSULE | Freq: Three times a day (TID) | ORAL | Status: DC
  Administered 2024-06-08 – 2024-06-09 (×4): 5 mg via ORAL
  Filled 2024-06-04 (×4): qty 1

## 2024-06-04 MED ORDER — LORAZEPAM 2 MG/ML IJ SOLN
1.0000 mg | Freq: Once | INTRAMUSCULAR | Status: AC
  Administered 2024-06-04: 1 mg via INTRAVENOUS
  Filled 2024-06-04: qty 1

## 2024-06-04 MED ORDER — CHLORDIAZEPOXIDE HCL 5 MG PO CAPS
10.0000 mg | ORAL_CAPSULE | Freq: Three times a day (TID) | ORAL | Status: AC
  Administered 2024-06-06 – 2024-06-07 (×6): 10 mg via ORAL
  Filled 2024-06-04 (×6): qty 2

## 2024-06-04 NOTE — Hospital Course (Addendum)
 23 year old female with history of polysubstance abuse with recreational substances including fentanyl , methamphetamine and alcohol abuse.  She is currently incarcerated and was brought to the ED after she started seizing.  She does not have a history of epilepsy.  She reportedly had a 3-minute long tonic-clonic seizure and was postictal afterwards.  Her last use of alcohol fentanyl  and methamphetamine was about 3 days ago.  She is a heavy alcohol consumer reported that she drinks half bottle of liquor to a case of beer per day.  She is having headaches now.  She had another seizure while in the ED that was witnessed by RN.  She has been treated with lorazepam and that has stopped her seizures for now.  She was noted to have mild hypocalcemia.  She is being admitted for acute alcohol withdrawal and suspected acute alcohol withdrawal seizures.

## 2024-06-04 NOTE — ED Triage Notes (Signed)
 Pt bib EMS from local jail for seizures they believe to be from Fentanyl  withdrawal. States pt has not had Fentanyl  in approximately 6 days. Reported seizure lasted 3 mins per jailhouse staff and pt was post-ictal. EMS reports pt alert and oriented on scene.

## 2024-06-04 NOTE — H&P (Addendum)
 History and Physical  [facility]  [place] FMW:[id number] DOB: [birth year] DOA: 06/04/2024  PCP: Pcp, No  Patient coming from: incarceration Level of care: Telemetry  I have personally briefly reviewed patient's old medical records in [facility]  Chief Complaint: seizure  HPI: Tanya Maldonado is a 23 year old female with history of polysubstance abuse with recreational substances including fentanyl , methamphetamine and alcohol abuse.  She is currently incarcerated and was brought to the ED after she started seizing.  She does not have a history of epilepsy.  She reportedly had a 3-minute long tonic-clonic seizure and was postictal afterwards.  Her last use of alcohol fentanyl  and methamphetamine was about 3 days ago.  She is a heavy alcohol consumer reported that she drinks half bottle of liquor to a case of beer per day.  She is having headaches now.  She had another seizure while in the ED that was witnessed by RN.  She has been treated with lorazepam and that has stopped her seizures for now.  She was noted to have mild hypocalcemia.  She is being admitted for acute alcohol withdrawal and suspected acute alcohol withdrawal seizures.    Past Medical History:  Diagnosis Date   Depression    Strep pharyngitis     Past Surgical History:  Procedure Laterality Date   ADENOIDECTOMY     TONSILLECTOMY       reports that she quit smoking about 4 years ago. Her smoking use included cigarettes. She started smoking about 5 years ago. She has never used smokeless tobacco. She reports current drug use. She reports that she does not drink alcohol.  Allergies  Allergen Reactions   Other Anaphylaxis    All nuts- throat closes up and blisters   Peanut-Containing Drug Products Anaphylaxis    Throat closes up and blisters    Family History  Problem Relation Age of Onset   Heart attack Maternal Grandfather    ADD / ADHD Brother    Bipolar disorder Sister    Depression  Sister     Prior to Admission medications   Medication Sig Start Date End Date Taking? Authorizing Provider  cetirizine  (ZYRTEC  ALLERGY) 10 MG tablet Take 1 tablet (10 mg total) by mouth daily for 14 days. 02/06/24 02/20/24  Elnor Jayson LABOR, DO  doxycycline  (VIBRAMYCIN ) 100 MG capsule Take 1 capsule (100 mg total) by mouth 2 (two) times daily. 02/10/24   Daralene Lonni BIRCH, PA-C  EPINEPHrine  0.3 mg/0.3 mL IJ SOAJ injection Inject 0.3 mg into the muscle as needed for anaphylaxis. 02/06/24   Elnor Jayson LABOR, DO  GOODSENSE PAIN RELIEF EXTRA ST 500 MG tablet Take 500 mg by mouth every 6 (six) hours as needed for mild pain (pain score 1-3). 02/01/24   [provider]  norelgestromin -ethinyl estradiol  (ORTHO EVRA) 150-35 MCG/24HR transdermal patch Place 1 patch onto the skin once a week. 01/17/19   Signa Delon LABOR, NP  omeprazole (PRILOSEC) 40 MG capsule Take 40 mg by mouth daily. 08/13/21   [provider]  ondansetron  (ZOFRAN ) 4 MG tablet Take 1 tablet (4 mg total) by mouth every 6 (six) hours. 01/07/20   Cleotilde Rogue, MD  prochlorperazine  (COMPAZINE ) 10 MG tablet Take 1 tablet (10 mg total) by mouth 2 (two) times daily as needed for nausea or vomiting. 11/20/20   Birdena Clarity, PA-C    Physical Exam: Vitals:   06/04/24 0641 06/04/24 0644 06/04/24 0646 06/04/24 0715  BP:   120/88 (!) 124/90  Pulse:   ROLLEN)  104 (!) 103  Resp:  16  16  Temp:  98.1 F (36.7 C)    TempSrc:  Oral    SpO2:   100% 100%  Weight: 59 kg     Height: 5' 3 (1.6 m)       Constitutional: NAD, calm, comfortable Eyes: PERRL, lids and conjunctivae normal ENMT: Mucous membranes are moist. Posterior pharynx clear of any exudate or lesions.Normal dentition.  Neck: normal, supple, no masses, no thyromegaly Respiratory: clear to auscultation bilaterally, no wheezing, no crackles. Normal respiratory effort. No accessory muscle use.  Cardiovascular: normal s1, s2 sounds, no murmurs / rubs / gallops. No extremity  edema. 2+ pedal pulses. No carotid bruits.  Abdomen: no tenderness, no masses palpated. No hepatosplenomegaly. Bowel sounds positive.  Musculoskeletal: no clubbing / cyanosis. No joint deformity upper and lower extremities. Good ROM, no contractures. Normal muscle tone.  Skin: no rashes, lesions, ulcers. No induration Neurologic: CN 2-12 grossly intact. Sensation intact, DTR normal. Strength 5/5 in all 4.  Psychiatric: Normal judgment and insight. Alert and oriented x 3. Normal mood.   Labs on Admission: I have personally reviewed following labs and imaging studies  CBC: Recent Labs  Lab 06/04/24 0744  WBC 6.6  NEUTROABS 4.2  HGB 13.8  HCT 41.3  MCV 93.7  PLT 397   Basic Metabolic Panel: Recent Labs  Lab 06/04/24 0744  NA 139  K 3.6  CL 106  CO2 23  GLUCOSE 88  BUN 8  CREATININE 0.69  CALCIUM 8.4*   GFR: Estimated Creatinine Clearance: 92 mL/min (by C-G formula based on SCr of 0.69 mg/dL). Liver Function Tests: Recent Labs  Lab 06/04/24 0744  AST 12*  ALT 10  ALKPHOS 66  BILITOT 0.4  PROT 6.5  ALBUMIN 4.0   Recent Labs  Lab 06/04/24 0744  LIPASE 33   No results for input(s): AMMONIA in the last 168 hours. Coagulation Profile: No results for input(s): INR, PROTIME in the last 168 hours. Cardiac Enzymes: No results for input(s): CKTOTAL, CKMB, CKMBINDEX, TROPONINI in the last 168 hours. BNP (last 3 results) No results for input(s): PROBNP in the last 8760 hours. HbA1C: No results for input(s): HGBA1C in the last 72 hours. CBG: No results for input(s): GLUCAP in the last 168 hours. Lipid Profile: No results for input(s): CHOL, HDL, LDLCALC, TRIG, CHOLHDL, LDLDIRECT in the last 72 hours. Thyroid  Function Tests: No results for input(s): TSH, T4TOTAL, FREET4, T3FREE, THYROIDAB in the last 72 hours. Anemia Panel: No results for input(s): VITAMINB12, FOLATE, FERRITIN, TIBC, IRON, RETICCTPCT in the last 72  hours. Urine analysis:    Component Value Date/Time   COLORURINE STRAW (A) 02/10/2024 1227   APPEARANCEUR CLEAR 02/10/2024 1227   LABSPEC 1.008 02/10/2024 1227   PHURINE 7.0 02/10/2024 1227   GLUCOSEU NEGATIVE 02/10/2024 1227   HGBUR NEGATIVE 02/10/2024 1227   BILIRUBINUR NEGATIVE 02/10/2024 1227   KETONESUR NEGATIVE 02/10/2024 1227   PROTEINUR NEGATIVE 02/10/2024 1227   UROBILINOGEN 0.2 06/01/2011 0435   NITRITE NEGATIVE 02/10/2024 1227   LEUKOCYTESUR TRACE (A) 02/10/2024 1227    Radiological Exams on Admission: CT Head Wo Contrast Result Date: 06/04/2024 EXAM: CT HEAD WITHOUT CONTRAST 06/04/2024 08:10:59 AM TECHNIQUE: CT of the head was performed without the administration of intravenous contrast. Automated exposure control, iterative reconstruction, and/or weight based adjustment of the mA/kV was utilized to reduce the radiation dose to as low as reasonably achievable. COMPARISON: None available. CLINICAL HISTORY: 24 year old female. Seizure. FINDINGS: BRAIN AND VENTRICLES:  No acute hemorrhage. No evidence of acute infarct. No hydrocephalus. No extra-axial collection. No mass effect or midline shift. Normal brain volume. No suspicious intracranial vascular hyperdensity. Gray-white differentiation is within normal limits. Normal for age non contrast CT appearance of the brain. ORBITS: No acute abnormality. SINUSES: Paranasal sinuses, middle ears and mastoids are clear. SOFT TISSUES AND SKULL: No acute soft tissue abnormality. No skull fracture. IMPRESSION: 1. Normal non-contrast head CT. Electronically signed by: Helayne Hurst MD 06/04/2024 08:19 AM EST RP Workstation: [id number]   DG Chest 1 View Result Date: 06/04/2024 EXAM: 1 VIEW(S) XRAY OF THE CHEST 06/04/2024 07:31:00 AM COMPARISON: Chest and rib radiographs 02/06/2024 and earlier. CLINICAL HISTORY: 23 year old female with chest pain. FINDINGS: LUNGS AND PLEURA: No focal pulmonary opacity. No pulmonary edema. No pleural effusion. No  pneumothorax. HEART AND MEDIASTINUM: No acute abnormality of the cardiac and mediastinal silhouettes. BONES AND SOFT TISSUES: No acute osseous abnormality. IMPRESSION: 1. Negative portable chest. Electronically signed by: Helayne Hurst MD 06/04/2024 07:34 AM EST RP Workstation: [id number]    EKG: Independently reviewed.   Assessment/Plan Principal Problem:   Alcohol withdrawal seizure (HCC) Active Problems:   Polysubstance abuse   Alcohol abuse   Fentanyl  use disorde   Methamphetamine abuse   Hypocalcemia   Sinus tachycardia   Presumed acute alcohol withdrawal seizure  -- last alcohol consumption 3 days ago since she has been incarcerated -- she is high risk for DTs -- continue CIWA and lorazepam as needed -- added scheduled librium taper  -- seizure precautions, neuro checks, EEG -- if worsens transfer to unit for precedex infusion or phenobarbital withdrawal protocol  Hypocalcemia -- IV calcium gluconate given   Sinus tachycardia -- treating supportively and as above  Polysubstance abuse --follow up urine drug screen (still pending) -- TOC consultation for substance abuse resources  DVT prophylaxis: enoxaparin   Code Status: Full   Family Communication:   Disposition Plan: return to incarceration when medically cleared   Consults called:   Admission status:  Time spent: 60 mins  Level of care: Telemetry Afton Louder MD [facility] How to contact the [facility] Attending or Consulting provider 7A - 7P or covering provider during after hours 7P -7A, for this patient?  Check the care team in [facility] and look for a) attending/consulting TRH provider listed and b) the TRH team listed Log into [email or web address] and use [facility]'s universal password to access. If you do not have the password, please contact the hospital operator. Locate the TRH provider you are looking for under [facility] and page to a number that you can be directly reached. If you still have  difficulty reaching the provider, please page the [facility] (Director on Call) for the Hospitalists listed on amion for assistance.   If 7PM-7AM, please contact night-coverage [email or web address] Password [facility]  06/04/2024, 10:48 AM

## 2024-06-04 NOTE — Progress Notes (Signed)
 Continues to mostly sleep.  Last CIWA scored 2 for headache and sweating.   Has has scheduled librium but no ativan since admit to room

## 2024-06-04 NOTE — ED Provider Notes (Signed)
 [place] EMERGENCY DEPARTMENT AT [facility] Provider Note   CSN: [id number] Arrival date & time: 06/04/24  9366     Patient presents with: Seizures   Tanya Maldonado is a 23 y.o. female.   23 year old female presents for evaluation of seizure.  She came from jail.  Per staff she had a 3-minute long seizure and was postictal afterward.  Patient is awake and alert at this time.  States she has no history of seizures.  States her last use of fentanyl  meth and alcohol was all 2 to 3 days ago.  She states that she has never had alcohol withdrawal before.  She states she used to drink half a bottle of liquor to a case of beer a day.  She admits some chest pain and headache at this time.  Denies any other symptoms or concerns.   Seizures      Prior to Admission medications   Medication Sig Start Date End Date Taking? Authorizing Provider  cetirizine  (ZYRTEC  ALLERGY) 10 MG tablet Take 1 tablet (10 mg total) by mouth daily for 14 days. 02/06/24 02/20/24  Elnor Jayson LABOR, DO  doxycycline  (VIBRAMYCIN ) 100 MG capsule Take 1 capsule (100 mg total) by mouth 2 (two) times daily. 02/10/24   Daralene Lonni BIRCH, PA-C  EPINEPHrine  0.3 mg/0.3 mL IJ SOAJ injection Inject 0.3 mg into the muscle as needed for anaphylaxis. 02/06/24   Elnor Jayson LABOR, DO  GOODSENSE PAIN RELIEF EXTRA ST 500 MG tablet Take 500 mg by mouth every 6 (six) hours as needed for mild pain (pain score 1-3). 02/01/24   [provider]  norelgestromin -ethinyl estradiol  (ORTHO EVRA) 150-35 MCG/24HR transdermal patch Place 1 patch onto the skin once a week. 01/17/19   Signa Delon LABOR, NP  omeprazole (PRILOSEC) 40 MG capsule Take 40 mg by mouth daily. 08/13/21   [provider]  ondansetron  (ZOFRAN ) 4 MG tablet Take 1 tablet (4 mg total) by mouth every 6 (six) hours. 01/07/20   Cleotilde Rogue, MD  prochlorperazine  (COMPAZINE ) 10 MG tablet Take 1 tablet (10 mg total) by mouth 2 (two) times daily as needed for nausea  or vomiting. 11/20/20   Idol, Julie, PA-C    Allergies: Other and Peanut-containing drug products    Review of Systems  Constitutional:  Negative for chills and fever.  HENT:  Negative for ear pain and sore throat.   Eyes:  Negative for pain and visual disturbance.  Respiratory:  Negative for cough and shortness of breath.   Cardiovascular:  Positive for chest pain. Negative for palpitations.  Gastrointestinal:  Negative for abdominal pain and vomiting.  Genitourinary:  Negative for dysuria and hematuria.  Musculoskeletal:  Negative for arthralgias and back pain.  Skin:  Negative for color change and rash.  Neurological:  Positive for seizures and headaches. Negative for syncope.  All other systems reviewed and are negative.   Updated Vital Signs BP (!) 124/90   Pulse (!) 103   Temp 98.1 F (36.7 C) (Oral)   Resp 16   Ht 5' 3 (1.6 m)   Wt 59 kg   SpO2 100%   BMI 23.03 kg/m   Physical Exam Vitals and nursing note reviewed.  Constitutional:      General: She is not in acute distress.    Appearance: Normal appearance. She is well-developed. She is not ill-appearing.  HENT:     Head: Normocephalic and atraumatic.  Eyes:     Conjunctiva/sclera: Conjunctivae normal.  Cardiovascular:  Rate and Rhythm: Regular rhythm. Tachycardia present.     Heart sounds: No murmur heard. Pulmonary:     Effort: Pulmonary effort is normal. No respiratory distress.     Breath sounds: Normal breath sounds.  Abdominal:     Palpations: Abdomen is soft.     Tenderness: There is no abdominal tenderness.  Musculoskeletal:        General: No swelling.     Cervical back: Neck supple.  Skin:    General: Skin is warm and dry.     Capillary Refill: Capillary refill takes less than 2 seconds.  Neurological:     Mental Status: She is alert.  Psychiatric:        Mood and Affect: Mood normal.     (all labs ordered are listed, but only abnormal results are displayed) Labs Reviewed  BASIC  METABOLIC PANEL WITH GFR - Abnormal; Notable for the following components:      Result Value   Calcium 8.4 (*)    All other components within normal limits  HEPATIC FUNCTION PANEL - Abnormal; Notable for the following components:   AST 12 (*)    Indirect Bilirubin 0.2 (*)    All other components within normal limits  CBC WITH DIFFERENTIAL/PLATELET  ETHANOL  LIPASE, BLOOD  URINALYSIS, ROUTINE W REFLEX MICROSCOPIC  PREGNANCY, URINE  URINE DRUG SCREEN    EKG: EKG Interpretation Date/Time:  Saturday June 04 2024 08:00:23 EST Ventricular Rate:  90 PR Interval:  162 QRS Duration:  87 QT Interval:  345 QTC Calculation: 423 R Axis:   82  Text Interpretation: Sinus rhythm Compared with prior EKG from 02/06/2024 Confirmed by Gennaro Bouchard ([id number]) on 06/04/2024 8:06:11 AM  Radiology: CT Head Wo Contrast Result Date: 06/04/2024 EXAM: CT HEAD WITHOUT CONTRAST 06/04/2024 08:10:59 AM TECHNIQUE: CT of the head was performed without the administration of intravenous contrast. Automated exposure control, iterative reconstruction, and/or weight based adjustment of the mA/kV was utilized to reduce the radiation dose to as low as reasonably achievable. COMPARISON: None available. CLINICAL HISTORY: 23 year old female. Seizure. FINDINGS: BRAIN AND VENTRICLES: No acute hemorrhage. No evidence of acute infarct. No hydrocephalus. No extra-axial collection. No mass effect or midline shift. Normal brain volume. No suspicious intracranial vascular hyperdensity. Gray-white differentiation is within normal limits. Normal for age non contrast CT appearance of the brain. ORBITS: No acute abnormality. SINUSES: Paranasal sinuses, middle ears and mastoids are clear. SOFT TISSUES AND SKULL: No acute soft tissue abnormality. No skull fracture. IMPRESSION: 1. Normal non-contrast head CT. Electronically signed by: Helayne Hurst MD 06/04/2024 08:19 AM EST RP Workstation: [id number]   DG Chest 1 View Result Date:  06/04/2024 EXAM: 1 VIEW(S) XRAY OF THE CHEST 06/04/2024 07:31:00 AM COMPARISON: Chest and rib radiographs 02/06/2024 and earlier. CLINICAL HISTORY: 23 year old female with chest pain. FINDINGS: LUNGS AND PLEURA: No focal pulmonary opacity. No pulmonary edema. No pleural effusion. No pneumothorax. HEART AND MEDIASTINUM: No acute abnormality of the cardiac and mediastinal silhouettes. BONES AND SOFT TISSUES: No acute osseous abnormality. IMPRESSION: 1. Negative portable chest. Electronically signed by: Helayne Hurst MD 06/04/2024 07:34 AM EST RP Workstation: [id number]     Procedures   Medications Ordered in the ED  LORazepam (ATIVAN) injection 1 mg (1 mg Intravenous Given 06/04/24 1035)  enoxaparin (LOVENOX) injection 40 mg (has no administration in time range)  calcium gluconate 1 g/ 50 mL sodium chloride  IVPB (has no administration in time range)  lactated ringers infusion (has no administration in time range)  acetaminophen  (TYLENOL ) tablet 650 mg (has no administration in time range)    Or  acetaminophen  (TYLENOL ) suppository 650 mg (has no administration in time range)  oxyCODONE (Oxy IR/ROXICODONE) immediate release tablet 5 mg (has no administration in time range)  ketorolac  (TORADOL ) 30 MG/ML injection 30 mg (has no administration in time range)  traZODone (DESYREL) tablet 25 mg (has no administration in time range)  bisacodyl (DULCOLAX) EC tablet 5 mg (has no administration in time range)  ondansetron  (ZOFRAN ) tablet 4 mg (has no administration in time range)    Or  ondansetron  (ZOFRAN ) injection 4 mg (has no administration in time range)  pantoprazole (PROTONIX) EC tablet 40 mg (has no administration in time range)  prochlorperazine  (COMPAZINE ) injection 10 mg (has no administration in time range)  nicotine (NICODERM CQ - dosed in mg/24 hours) patch 21 mg (has no administration in time range)  LORazepam (ATIVAN) tablet 1-4 mg (has no administration in time range)    Or  LORazepam  (ATIVAN) injection 1-4 mg (has no administration in time range)  thiamine (VITAMIN B1) tablet 100 mg (has no administration in time range)    Or  thiamine (VITAMIN B1) injection 100 mg (has no administration in time range)  folic acid (FOLVITE) tablet 1 mg (has no administration in time range)  multivitamin with minerals tablet 1 tablet (has no administration in time range)  chlordiazePOXIDE (LIBRIUM) capsule 25 mg (has no administration in time range)    Followed by  chlordiazePOXIDE (LIBRIUM) capsule 10 mg (has no administration in time range)    Followed by  chlordiazePOXIDE (LIBRIUM) capsule 5 mg (has no administration in time range)  LORazepam (ATIVAN) injection 1 mg (1 mg Intravenous Given 06/04/24 0751)                                    Medical Decision Making Cardiac monitor interpretation: Sinus tachycardia, no ectopy  Social determinants of health: Patient currently incarcerated, history of fentanyl , meth, and alcohol abuse  Patient here for seizure at the jail today.  Reportedly lasted 3 minutes and had a postictal period.  She maybe had another 1 here and it was unwitnessed by me.  Was given a dose of Ativan.  She is awake alert with stable vital signs other than some tachycardia.  Lab workup and imaging unremarkable.  I think she likely is having alcohol withdrawal at this time.  Discussed patient's case with hospitalist and patient will be admitted for further workup and management.  Patient is agreeable with the plan.  Problems Addressed: Alcohol withdrawal seizure without complication (HCC): acute illness or injury that poses a threat to life or bodily functions Substance use disorder: chronic illness or injury with exacerbation, progression, or side effects of treatment  Amount and/or Complexity of Data Reviewed Independent Historian: EMS    Details: EMS help to provide history of patient seizure, which lasted about 3 minutes and she did have a postictal  period External Data Reviewed: notes.    Details: Prior ED records reviewed and patient seen 8 - 1 - 25 for finger cellulitis Labs: ordered. Decision-making details documented in ED Course.    Details: Ordered and reviewed by me and unremarkable Radiology: ordered and independent interpretation performed. Decision-making details documented in ED Course.    Details: Ordered and interpreted by me independently of radiology  CT head: Shows no acute abnormality Chest x-ray: Shows no acute process in  the chest ECG/medicine tests: ordered and independent interpretation performed. Decision-making details documented in ED Course.    Details: Ordered and inter by me in the absence of cardiology and EKG shows sinus rhythm, no STEMI or significant change when compared to prior Discussion of management or test interpretation with external provider(s): Dr. Vicci - hospitalist -I spoke with him on the phone regarding the patient's case and he will admit the patient for further workup and management  Risk OTC drugs. Prescription drug management. Parenteral controlled substances. Drug therapy requiring intensive monitoring for toxicity. Decision regarding hospitalization.    Final diagnoses:  Alcohol withdrawal seizure without complication [facility])  Substance use disorder    ED Discharge Orders     None          Gennaro Duwaine CROME, DO 06/04/24 1101

## 2024-06-04 NOTE — Progress Notes (Signed)
 Alert and oriented since admission to room.  Ambulated to bathroom and voided for urine sample.  No seizure activity noted.  Stated used [place] on Wednesday.  Ordered medications given.   Guard at bedside, cuffs in place

## 2024-06-04 NOTE — ED Notes (Signed)
 Nurses called to room patient actively seizing for a minute per sheriff officer with patient. MD notified.

## 2024-06-05 DIAGNOSIS — Z8249 Family history of ischemic heart disease and other diseases of the circulatory system: Secondary | ICD-10-CM | POA: Diagnosis not present

## 2024-06-05 DIAGNOSIS — Z87892 Personal history of anaphylaxis: Secondary | ICD-10-CM | POA: Diagnosis not present

## 2024-06-05 DIAGNOSIS — R112 Nausea with vomiting, unspecified: Secondary | ICD-10-CM | POA: Diagnosis not present

## 2024-06-05 DIAGNOSIS — R569 Unspecified convulsions: Secondary | ICD-10-CM | POA: Diagnosis present

## 2024-06-05 DIAGNOSIS — Z7151 Drug abuse counseling and surveillance of drug abuser: Secondary | ICD-10-CM | POA: Diagnosis not present

## 2024-06-05 DIAGNOSIS — Z79899 Other long term (current) drug therapy: Secondary | ICD-10-CM | POA: Diagnosis not present

## 2024-06-05 DIAGNOSIS — Z818 Family history of other mental and behavioral disorders: Secondary | ICD-10-CM | POA: Diagnosis not present

## 2024-06-05 DIAGNOSIS — Z91018 Allergy to other foods: Secondary | ICD-10-CM | POA: Diagnosis not present

## 2024-06-05 DIAGNOSIS — Z91010 Allergy to peanuts: Secondary | ICD-10-CM | POA: Diagnosis not present

## 2024-06-05 DIAGNOSIS — Z793 Long term (current) use of hormonal contraceptives: Secondary | ICD-10-CM | POA: Diagnosis not present

## 2024-06-05 DIAGNOSIS — F1510 Other stimulant abuse, uncomplicated: Secondary | ICD-10-CM | POA: Diagnosis present

## 2024-06-05 DIAGNOSIS — F1110 Opioid abuse, uncomplicated: Secondary | ICD-10-CM | POA: Diagnosis present

## 2024-06-05 DIAGNOSIS — F10939 Alcohol use, unspecified with withdrawal, unspecified: Secondary | ICD-10-CM | POA: Diagnosis present

## 2024-06-05 DIAGNOSIS — R000 Tachycardia, unspecified: Secondary | ICD-10-CM | POA: Diagnosis present

## 2024-06-05 DIAGNOSIS — G9389 Other specified disorders of brain: Secondary | ICD-10-CM | POA: Diagnosis present

## 2024-06-05 DIAGNOSIS — F1910 Other psychoactive substance abuse, uncomplicated: Secondary | ICD-10-CM | POA: Diagnosis not present

## 2024-06-05 DIAGNOSIS — F10139 Alcohol abuse with withdrawal, unspecified: Secondary | ICD-10-CM | POA: Diagnosis present

## 2024-06-05 DIAGNOSIS — F17210 Nicotine dependence, cigarettes, uncomplicated: Secondary | ICD-10-CM | POA: Diagnosis present

## 2024-06-05 DIAGNOSIS — Z889 Allergy status to unspecified drugs, medicaments and biological substances status: Secondary | ICD-10-CM | POA: Diagnosis not present

## 2024-06-05 DIAGNOSIS — T7621XA Adult sexual abuse, suspected, initial encounter: Secondary | ICD-10-CM | POA: Diagnosis present

## 2024-06-05 LAB — MAGNESIUM: Magnesium: 2.1 mg/dL (ref 1.7–2.4)

## 2024-06-05 LAB — COMPREHENSIVE METABOLIC PANEL WITH GFR
ALT: 8 U/L (ref 0–44)
AST: 14 U/L — ABNORMAL LOW (ref 15–41)
Albumin: 4 g/dL (ref 3.5–5.0)
Alkaline Phosphatase: 71 U/L (ref 38–126)
Anion gap: 9 (ref 5–15)
BUN: 9 mg/dL (ref 6–20)
CO2: 26 mmol/L (ref 22–32)
Calcium: 9.1 mg/dL (ref 8.9–10.3)
Chloride: 105 mmol/L (ref 98–111)
Creatinine, Ser: 0.82 mg/dL (ref 0.44–1.00)
GFR, Estimated: >60 mL/min (ref >60)
Glucose, Bld: 120 mg/dL — ABNORMAL HIGH (ref 70–99)
Potassium: 3.7 mmol/L (ref 3.5–5.1)
Sodium: 140 mmol/L (ref 135–145)
Total Bilirubin: 0.3 mg/dL (ref 0.0–1.2)
Total Protein: 6.7 g/dL (ref 6.5–8.1)

## 2024-06-05 LAB — PHOSPHORUS: Phosphorus: 2.8 mg/dL (ref 2.5–4.6)

## 2024-06-05 NOTE — Progress Notes (Signed)
 Cassandra RN from the jail called for an update. Updated provided and she was made aware that patient is awaiting EEG. [place] RN

## 2024-06-05 NOTE — Progress Notes (Signed)
 PROGRESS NOTE    Tanya Maldonado  FMW:[id number] DOB: 05/08/2001 DOA: 06/04/2024 PCP: Pcp, No   Brief Narrative:   23 year old female with history of polysubstance abuse with recreational substances including fentanyl , methamphetamine and alcohol abuse. She is currently incarcerated and was brought to the ED after she started seizing. She does not have a history of epilepsy. Her last use of alcohol fentanyl  and methamphetamine was about 3 days ago. She is a heavy alcohol consumer reported that she drinks half bottle of liquor to a case of beer per day. She had another seizure while in the ED. she is currently being managed for acute alcohol withdrawal seizures.   Assessment & Plan:  Principal Problem:   Alcohol withdrawal seizure (HCC) Active Problems:   Polysubstance abuse   Alcohol abuse   Fentanyl  use disorde   Methamphetamine abuse   Hypocalcemia   Sinus tachycardia   Acute alcohol withdrawal seizures, POA: -- last alcohol consumption 3 days prior to admission since she has been incarcerated -- she is high risk for DTs -- continue CIWA and lorazepam as needed -- Continue with scheduled librium taper  -- seizure precautions, neuro checks, EEG   Hypocalcemia -- As needed repletion   Sinus tachycardia.  Likely in the setting of amphetamine use -- treating supportively and as above   Polysubstance abuse --Positive for THC, fentanyl  as well as amphetamines -- TOC consultation for substance abuse resources -- Counseled extensively regarding cessation of any illicit drugs  Disposition: Back to jail   DVT prophylaxis: enoxaparin (LOVENOX) injection 40 mg Start: 06/04/24 2200     Code Status: Full Code Family Communication: No family member At the bedside.  Officer was present at the bedside Status is: Observation The patient remains OBS appropriate and will d/c before 2 midnights.    Subjective:  She states that she feels fine.  She did sleep some last night.  She  never had episodes of seizures before this hospitalization.  She said that her last use of fentanyl  and alcohol was on Thursday.  We spoke about the management of acute alcohol withdrawal seizures.   Examination:  General exam: Appears calm and comfortable  Respiratory system: Clear to auscultation. Respiratory effort normal. Cardiovascular system: S1 & S2 heard, RRR. No JVD, murmurs, rubs, gallops or clicks. No pedal edema. Gastrointestinal system: Abdomen is nondistended, soft and nontender. No organomegaly or masses felt. Normal bowel sounds heard. Central nervous system: Alert and oriented. No focal neurological deficits. Extremities: Symmetric 5 x 5 power. Skin: No rashes, lesions or ulcers    Diet Orders (From admission, onward)     Start     Ordered   06/04/24 1043  Diet regular Room service appropriate? Yes; Fluid consistency: Thin  Diet effective now       Question Answer Comment  Room service appropriate? Yes   Fluid consistency: Thin      06/04/24 1046            Objective: Vitals:   06/04/24 2325 06/05/24 0354 06/05/24 0453 06/05/24 0820  BP: 110/84 109/72 118/70   Pulse: 84 100 100   Resp:    14  Temp: 97.6 F (36.4 C) 98.2 F (36.8 C) 98.1 F (36.7 C)   TempSrc: Axillary Oral Oral   SpO2: 99%     Weight:      Height:        Intake/Output Summary (Last 24 hours) at 06/05/2024 0851 Last data filed at 06/05/2024 0433 Gross per 24 hour  Intake 1020 ml  Output 300 ml  Net 720 ml   Filed Weights   06/04/24 0641 06/04/24 1116  Weight: 59 kg 63.4 kg    Scheduled Meds:  chlordiazePOXIDE  25 mg Oral TID   Followed by   NOREEN ON 06/06/2024] chlordiazePOXIDE  10 mg Oral TID   Followed by   NOREEN ON 06/08/2024] chlordiazePOXIDE  5 mg Oral TID   enoxaparin (LOVENOX) injection  40 mg Subcutaneous Q24H   folic acid  1 mg Oral Daily   multivitamin with minerals  1 tablet Oral Daily   pantoprazole  40 mg Oral QPM   thiamine  100 mg Oral Daily   Or    thiamine  100 mg Intravenous Daily   Continuous Infusions:  lactated ringers 75 mL/hr at 06/05/24 0055    Nutritional status     Body mass index is 24.75 kg/m.  Data Reviewed:   CBC: Recent Labs  Lab 06/04/24 0744  WBC 6.6  NEUTROABS 4.2  HGB 13.8  HCT 41.3  MCV 93.7  PLT 397   Basic Metabolic Panel: Recent Labs  Lab 06/04/24 0744 06/05/24 0536  NA 139 140  K 3.6 3.7  CL 106 105  CO2 23 26  GLUCOSE 88 120*  BUN 8 9  CREATININE 0.69 0.82  CALCIUM 8.4* 9.1  MG  --  2.1  PHOS  --  2.8   GFR: Estimated Creatinine Clearance: 97.3 mL/min (by C-G formula based on SCr of 0.82 mg/dL). Liver Function Tests: Recent Labs  Lab 06/04/24 0744 06/05/24 0536  AST 12* 14*  ALT 10 8  ALKPHOS 66 71  BILITOT 0.4 0.3  PROT 6.5 6.7  ALBUMIN 4.0 4.0   Recent Labs  Lab 06/04/24 0744  LIPASE 33   No results for input(s): AMMONIA in the last 168 hours. Coagulation Profile: No results for input(s): INR, PROTIME in the last 168 hours. Cardiac Enzymes: No results for input(s): CKTOTAL, CKMB, CKMBINDEX, TROPONINI in the last 168 hours. BNP (last 3 results) No results for input(s): PROBNP in the last 8760 hours. HbA1C: No results for input(s): HGBA1C in the last 72 hours. CBG: No results for input(s): GLUCAP in the last 168 hours. Lipid Profile: No results for input(s): CHOL, HDL, LDLCALC, TRIG, CHOLHDL, LDLDIRECT in the last 72 hours. Thyroid  Function Tests: No results for input(s): TSH, T4TOTAL, FREET4, T3FREE, THYROIDAB in the last 72 hours. Anemia Panel: No results for input(s): VITAMINB12, FOLATE, FERRITIN, TIBC, IRON, RETICCTPCT in the last 72 hours. Sepsis Labs: No results for input(s): PROCALCITON, LATICACIDVEN in the last 168 hours.  No results found for this or any previous visit (from the past 240 hours).       Radiology Studies: CT Head Wo Contrast Result Date: 06/04/2024 EXAM: CT HEAD  WITHOUT CONTRAST 06/04/2024 08:10:59 AM TECHNIQUE: CT of the head was performed without the administration of intravenous contrast. Automated exposure control, iterative reconstruction, and/or weight based adjustment of the mA/kV was utilized to reduce the radiation dose to as low as reasonably achievable. COMPARISON: None available. CLINICAL HISTORY: 23 year old female. Seizure. FINDINGS: BRAIN AND VENTRICLES: No acute hemorrhage. No evidence of acute infarct. No hydrocephalus. No extra-axial collection. No mass effect or midline shift. Normal brain volume. No suspicious intracranial vascular hyperdensity. Gray-white differentiation is within normal limits. Normal for age non contrast CT appearance of the brain. ORBITS: No acute abnormality. SINUSES: Paranasal sinuses, middle ears and mastoids are clear. SOFT TISSUES AND SKULL: No acute soft tissue abnormality. No  skull fracture. IMPRESSION: 1. Normal non-contrast head CT. Electronically signed by: Helayne Hurst MD 06/04/2024 08:19 AM EST RP Workstation: [id number]   DG Chest 1 View Result Date: 06/04/2024 EXAM: 1 VIEW(S) XRAY OF THE CHEST 06/04/2024 07:31:00 AM COMPARISON: Chest and rib radiographs 02/06/2024 and earlier. CLINICAL HISTORY: 23 year old female with chest pain. FINDINGS: LUNGS AND PLEURA: No focal pulmonary opacity. No pulmonary edema. No pleural effusion. No pneumothorax. HEART AND MEDIASTINUM: No acute abnormality of the cardiac and mediastinal silhouettes. BONES AND SOFT TISSUES: No acute osseous abnormality. IMPRESSION: 1. Negative portable chest. Electronically signed by: Helayne Hurst MD 06/04/2024 07:34 AM EST RP Workstation: [id number]           LOS: 0 days   Time spent= 35 mins    Deliliah Room, MD [facility]  If 7PM-7AM, please contact night-coverage  06/05/2024, 8:51 AM

## 2024-06-05 NOTE — Discharge Instructions (Signed)
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 to stop it early. Keep taking it even if you think you are better. Talk to your care team about the use of this medication in children. While it may be prescribed for children for  selected conditions, precautions do apply. Overdosage: If you think you have taken too much of this medicine contact a poison control center or emergency room at once. NOTE: This medicine is only for you. Do not share this medicine with others.  What if I miss a dose? If you miss a dose, take it as soon as you can. If it is almost time for your next dose, take only that dose. Do not take double or extra doses.  What may interact with this medication? Do not take  this medication with any of the following: Cisapride Dronedarone Pimozide Thioridazine This medication may also interact with the following: Antacids that contain aluminum or magnesium Colchicine Cyclosporine Digoxin Ergot alkaloids, such as dihydroergotamine, ergotamine Estrogen or progestin hormones Nelfinavir Other medications that cause heart rhythm change Phenytoin Warfarin  This list may not describe all possible interactions. Give your health care provider a list of all the medicines, herbs, non-prescription drugs, or dietary supplements you use. Also tell them if you smoke, drink alcohol, or use illegal drugs. Some items may interact with your medicine.  What should I watch for while using this medication? Tell your care team if your symptoms do not start to get better or if they get worse. This medication may cause serious skin reactions. They can happen weeks to months after starting the medication. Contact your care team right away if you notice fevers or flu-like symptoms with a rash. The rash may be red or purple and then turn into blisters or peeling of the skin. Or, you might notice a red rash with swelling of the face, lips or lymph nodes in your neck or under your arms. Do not treat diarrhea with over the counter products. Contact your care team if you have diarrhea that lasts more than 2 days or if it is severe and watery. This medication can make you more sensitive to the sun. Keep out of the sun. If you  cannot avoid being in the sun, wear protective clothing and use sunscreen. Do not use sun lamps or tanning beds/booths. What side effects may I notice from receiving this medication? Side effects that you should report to your care team as soon as possible: Allergic reactions or angioedema--skin rash, itching, hives, swelling of the face, eyes, lips, tongue, arms, or legs, trouble swallowing or breathing Heart rhythm changes--fast or irregular heartbeat, dizziness, feeling faint or lightheaded, chest pain, trouble breathing Liver injury--right upper belly pain, loss of appetite, nausea, light-colored stool, dark yellow or brown urine, yellowing skin or eyes, unusual weakness or fatigue Rash, fever, and swollen lymph nodes Redness, blistering, peeling, or loosening of the skin, including inside the mouth Severe diarrhea, fever Unusual vaginal discharge, itching, or odor Side effects that usually do not require medical attention (report to your care team if they continue or are bothersome): Diarrhea Nausea Stomach pain Vomiting  This list may not describe all possible side effects. Call your doctor for medical advice about side effects. You may report side effects to FDA at 1-800-FDA-1088.  Where should I keep my medication? Keep out of the reach of children and pets. Store at room temperature between 15 and 30 degrees C (59 and 86 degrees F). Throw away any unused medication after the expiration date.  NOTE: This sheet is a summary. It may not cover all possible information. If you have questions about this medicine, talk to your doctor, pharmacist, or health care provider.  2024 Elsevier/Gold Standard (2022-04-04 00:00:00)   Ceftriaxone Injection - GIVEN BY THE BEDSIDE RN  What is this medication? CEFTRIAXONE (sef try AX one) treats infections caused by bacteria. It belongs to a group of medications called cephalosporin antibiotics. It will not treat colds, the flu, or infections caused  by viruses. This medicine may be used for other purposes; ask your health care provider or pharmacist if you have questions. COMMON BRAND NAME(S): Ceftri-IM, Ceftrisol Plus, Rocephin  What should I tell my care team before I take this medication? They need to know if you have any of  these conditions: Bleeding disorder High bilirubin level in newborn patients Kidney disease Liver disease Poor nutrition An unusual or allergic reaction to ceftriaxone, other penicillin or cephalosporin antibiotics, other medications, foods, dyes, or preservatives Pregnant or trying to get pregnant Breast-feeding  How should I use this medication? This medication is injected into a vein or a muscle. It is usually given by your care team in a hospital or clinic setting. It may also be given at home. If you get this medication at home, you will be taught how to prepare and give it. Use exactly as directed. Take it as directed on the prescription label at the same time every day. Keep taking it even if you think you are better. It is important that you put your used needles and syringes in a special sharps container. Do not put them in a trash can. If you do not have a sharps container, call your pharmacist or care team to get one. Talk to your care team about the use of this medication in children. While it may be prescribed for children as young as newborns for selected conditions, precautions do apply. Overdosage: If you think you have taken too much of this medicine contact a poison control center or emergency room at once. NOTE: This medicine is only for you. Do not share this medicine with others.  What if I miss a dose? If you get this medication at the hospital or clinic: It is important not to miss your dose. Call your care team if you are unable to keep an appointment. If you give yourself this medication at home: If you miss a dose, take it as soon as you can. Then continue your normal schedule. If it is  almost time for your next dose, take only that dose. Do not take double or extra doses. Call your care team with questions. What may interact with this medication? Estrogen or progestin hormones Intravenous calcium This list may not describe all possible interactions. Give your health care provider a list of all the medicines, herbs, non-prescription drugs, or dietary supplements you use. Also tell them if you smoke, drink alcohol, or use illegal drugs. Some items may interact with your medicine.  What should I watch for while using this medication? Tell your care team if your symptoms do not start to get better or if they get worse. Do not treat diarrhea with over the counter products. Contact your care team if you have diarrhea that lasts more than 2 days or if it is severe and watery. If you have diabetes, you may get a false-positive result for sugar in your urine. Check with your care team. If you are being treated for a sexually transmitted infection (STI), avoid sexual contact until you have finished your treatment. Your partner may also need treatment.  What side effects may I notice from receiving this medication? Side effects that you should report to your care team as soon as possible: Allergic reactions--skin rash, itching, hives, swelling of the face, lips, tongue, or throat Hemolytic anemia--unusual weakness or fatigue, dizziness, headache, trouble breathing, dark urine, yellowing skin or eyes Severe diarrhea, fever Unusual vaginal discharge, itching, or odor Side effects that usually do not require medical attention (report to your care team if they continue or are bothersome): Diarrhea Headache Nausea Pain, redness, or irritation at injection site  This list may not describe all possible side effects. Call your doctor for medical advice about side effects. You may report side effects to FDA  at 1-800-FDA-1088.  Where should I keep my medication? Keep out of the reach of  children and pets. You will be instructed on how to store this medication. Get rid of any unused medication after the expiration date. To get rid of medications that are no longer needed or have expired: Take the medication to a medication take-back program. Check with your pharmacy or law enforcement to find a location. If you cannot return the medication, ask your pharmacist or care team how to get rid of this medication safely.  NOTE: This sheet is a summary. It may not cover all possible information. If you have questions about this medicine, talk to your doctor, pharmacist, or health care provider.  2024 Elsevier/Gold Standard (2021-09-23 00:00:00)    Metronidazole Capsules or Tablets - GIVEN BY THE BEDSIDE RN  What is this medication? METRONIDAZOLE (me troe NI da zole) treats infections caused by bacteria or parasites. It belongs to a group of medications called antibiotics. It will not treat colds, the flu, or infections caused by viruses. This medicine may be used for other purposes; ask your health care provider or pharmacist if you have questions. COMMON BRAND NAME(S): Flagyl  What should I tell my care team before I take this medication? They need to know if you have any of these conditions: Cockayne syndrome History of blood diseases, such as sickle cell anemia, anemia, or leukemia Frequently drink alcohol Irregular heartbeat or rhythm Kidney disease Liver disease Yeast or fungal infection An unusual or allergic reaction to metronidazole, other medications, foods, dyes, or preservatives Pregnant or trying to get pregnant Breastfeeding  How should I use this medication? Take this medication by mouth with water. Take it as directed on the prescription label at the same time every day. Take all of this medication unless your care team tells you to stop it early. Keep taking it even if you think you are better. Talk to your care team about the use of this medication in  children. While it may be prescribed for children for selected conditions, precautions do apply. Overdosage: If you think you have taken too much of this medicine contact a poison control center or emergency room at once. NOTE: This medicine is only for you. Do not share this medicine with others.  What if I miss a dose? If you miss a dose, take it as soon as you can. If it is almost time for your next dose, take only that dose. Do not take double or extra doses.  What may interact with this medication? Do not take this medication with any of the following: Alcohol or any product containing alcohol Cisapride Disulfiram Dronedarone Pimozide Thioridazine This medication may also interact with the following: Busulfan Carbamazepine Certain medications that treat or prevent blood clots, such as warfarin Cimetidine Estrogen or progestin hormones Lithium Other medications that cause heart rhythm changes Phenobarbital Phenytoin This list may not describe all possible interactions. Give your health care provider a list of all the medicines, herbs, non-prescription drugs, or dietary supplements you use. Also tell them if you smoke, drink alcohol, or use illegal drugs. Some items may interact with your medicine.  What should I watch for while using this medication? Visit your care team for regular checks on your progress. Tell your care team if your symptoms do not start to get better or if they get worse. Some products may contain alcohol. Ask your care team if this medication contains alcohol. Be sure to tell all care teams you  are taking this medication. Certain medications, such as metronidazole and disulfiram, can cause an unpleasant reaction when taken with alcohol. The reaction includes flushing, headache, nausea, vomiting, sweating, and increased thirst. The reaction can last from 30 minutes to several hours. If you are being treated for a sexually transmitted infection (STI), avoid sexual  contact until you have finished your treatment. Your partner may also need treatment. Estrogen and progestin hormones may not work as well while you are taking this medication. A barrier contraceptive, such as a condom or diaphragm, is recommended if you are using these hormones for contraception. Talk to your care team about effective forms of contraception.  What side effects may I notice from receiving this medication? Side effects that you should report to your care team as soon as possible: Allergic reactions--skin rash, itching, hives, swelling of the face, lips, tongue, or throat Dizziness, loss of balance or coordination, confusion or trouble speaking Fever, neck pain or stiffness, sensitivity to light, headache, nausea, vomiting, confusion Heart rhythm changes--fast or irregular heartbeat, dizziness, feeling faint or lightheaded, chest pain, trouble breathing Liver injury--right upper belly pain, loss of appetite, nausea, light-colored stool, dark yellow or brown urine, yellowing skin or eyes, unusual weakness or fatigue Pain, tingling, or numbness in the hands or feet Redness, blistering, peeling, or loosening of the skin, including inside the mouth Seizures Severe diarrhea, fever Sudden eye pain or change in vision such as blurry vision, seeing halos around lights, vision loss Unusual vaginal discharge, itching, or odor Side effects that usually do not require medical attention (report these to your care team if they continue or are bothersome): Diarrhea Metallic taste in mouth Nausea Stomach pain  This list may not describe all possible side effects. Call your doctor for medical advice about side effects. You may report side effects to FDA at 1-800-FDA-1088.  Where should I keep my medication? Keep out of the reach of children and pets. Store between 15 and 25 degrees C (59 and 77 degrees F). Protect from light. Get rid of any unused medication after the expiration date. To get  rid of medications that are no longer needed or have expired: Take the medication to a medication take-back program. Check with your pharmacy or law enforcement to find a location. If you cannot return the medication, check the label or package insert to see if the medication should be thrown out in the garbage or flushed down the toilet. If you are not sure, ask your care team. If it is safe to put it in the trash, take the medication out of the container. Mix the medication with cat litter, dirt, coffee grounds, or other unwanted substance. Seal the mixture in a bag or container. Put it in the trash.  NOTE: This sheet is a summary. It may not cover all possible information. If you have questions about this medicine, talk to your doctor, pharmacist, or health care provider.  2024 Elsevier/Gold Standard (2022-08-05 00:00:00)

## 2024-06-05 NOTE — Progress Notes (Addendum)
 30 seconds of seizure like activity in which patient's full body was jerking. No episode of incontinence and patient was completely oriented and then sat up to eat lunch. Kellogg RN

## 2024-06-05 NOTE — TOC CM/SW Note (Signed)
 Transition of Care [facility]) - Inpatient Brief Assessment   Patient Details  Name: Tanya Maldonado MRN: [id number] Date of Birth: Nov 11, 2000  [facility]) CM/SW Contact:    Noreen KATHEE Pinal, LCSWA Phone Number: 06/05/2024, 8:38 AM   Clinical Narrative:  Inpatient Care Management (ICM) has reviewed patient and no other ICM needs have been identified at this time. We will continue to monitor patient advancement through interdisciplinary progression rounds. If new patient transition needs arise, please place a ICM consult.  Transition of Care Asessment: Insurance and Status: Insurance coverage has been reviewed Patient has primary care physician: No (PCP list added to AVS) Home environment has been reviewed: Patient is from jail Prior level of function:: Independent Prior/Current Home Services: No current home services Social Drivers of Health Review: SDOH reviewed interventions complete (resources added to AVS) Readmission risk has been reviewed: Yes Transition of care needs: no transition of care needs at this time

## 2024-06-05 NOTE — Progress Notes (Addendum)
 1407 patient had full body jerking x 30 sec. No incontinence. Was not postictal. Was able to state DOB and current month.   1409 another episode, same as above.   1417 Another episode of full body jerking last about 40 sec. No incontinence. Pt not postical. Was able to almost immediately answer this RN's questions. She was tearful and stating  why does this keep happening to me!. 1 mg administered. She then she requested saltines.   Dr Dino  and charge RN made aware. Bed rails padded. Oxygen set up at bedside. Bed locked in lowest position. Bertina Solum, RN

## 2024-06-05 NOTE — Progress Notes (Signed)
   06/05/24 1439  Vitals  BP 106/73  MAP (mmHg) 83  BP Location Left Arm  BP Method Automatic  Patient Position (if appropriate) Lying  Pulse Rate (!) 104  Level of Consciousness  Level of Consciousness Alert  MEWS COLOR  MEWS Score Color Yellow  Oxygen Therapy  SpO2 99 %  O2 Device Room Air  Pain Assessment  Pain Scale 0-10  Pain Score 8  Pain Type Acute pain  Pain Location Head  MEWS Score  MEWS Temp 0  MEWS Systolic 0  MEWS Pulse 1  MEWS RR 2  MEWS LOC 0  MEWS Score 3   Notified by secretary that pt called out due to seizure. In to room to find pt lying on her right side, eyelids fluttering and bilateral legs shaking. Asked pt what was wrong and she stated, I don't know. I'm just shaking all over and I can't quit. I feel like my heart starts beating real hard and fast and then I just shake all over. VSS. Pupils 4, equal and briskly reactive. Follows all commands, appropriate hand/eye coordination. Pt then began shaking all over, eyes closed. Called her name and she looked up at me then closed her eyes again. Asked her to tell me her name and DOB, pt didn't answer. Gentle pressure applied to nailbed and pt yelled out. Ow!, then she answered my questions and shaking stopped. Pt assisted to sitting position in bed, covers straightened. Pt reassured and advised pt that she was stable, no seizure activity noted. Pt stated, OK. Requested soda and crackers, these were provided. Advised pt and deputy to call for any further needs.

## 2024-06-05 NOTE — Progress Notes (Addendum)
 Pt is alert and oriented x 4 upon shift assessment. C/o headache since seizure early this morning. Lungs fields clear but diminished bilaterally. No further complaints at this time. She is in custody of [employer]. RT ankle in forensic restraint.Kellogg RN

## 2024-06-05 NOTE — Progress Notes (Signed)
 Patient experienced a tonic clonic seizure lasting approximately 2 minutes around 0345H. Seizures precaution maintained. Pt placed in side lying position for airway protection. Vital signs obtained post episode.Stable. Pt observed to be drowsy but arousable following event. AOX4 after few minutes. Complaint of headache. No apparent injury noted.   Press photographer and NP on duty notified.NP reassessed the pt at bedside.Administered medications as ordered. Will continue to monitor patient closely.

## 2024-06-06 ENCOUNTER — Inpatient Hospital Stay (HOSPITAL_COMMUNITY)
Admit: 2024-06-06 | Discharge: 2024-06-06 | Disposition: A | Discharge status: Home / Self Care | Attending: Family Medicine | Admitting: Family Medicine

## 2024-06-06 DIAGNOSIS — F10939 Alcohol use, unspecified with withdrawal, unspecified: Secondary | ICD-10-CM | POA: Diagnosis not present

## 2024-06-06 DIAGNOSIS — R569 Unspecified convulsions: Secondary | ICD-10-CM | POA: Diagnosis not present

## 2024-06-06 MED ORDER — KETOROLAC TROMETHAMINE 15 MG/ML IJ SOLN
15.0000 mg | Freq: Once | INTRAMUSCULAR | Status: AC
  Administered 2024-06-06: 15 mg via INTRAVENOUS
  Filled 2024-06-06: qty 1

## 2024-06-06 NOTE — Plan of Care (Signed)

## 2024-06-06 NOTE — Progress Notes (Addendum)
 This RN notified of pt having seizure-like sx by officer at bedside. Upon arrival to the room, pt with full body jerking movement x30s. Pt withdraws from light on pupilary assessment. Pt without incontinence. Pt not postictal. Able to state name and DOB. Ambulatory to bathroom. 1mg  ativan admin. Consulting Civil Engineer and provider notified.

## 2024-06-06 NOTE — Procedures (Signed)
 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  EEG to seizure.  Level of alertness: Awake  AEDs during EEG study: Librium  Technical aspects: This EEG study was done with scalp electrodes positioned according to the 10-20 International system of electrode placement. Electrical activity was reviewed with band pass filter of 1-70Hz , sensitivity of 7 uV/mm, display speed of 61mm/sec with a 60Hz  notched filter applied as appropriate. EEG data were recorded continuously and digitally stored.  Video monitoring was available and reviewed as appropriate.  Description: EEG showed continuous generalized 5 to 6 Hz delta slowing admixed with 13-15 Hz beta activity.  Hyperventilation and photic stimulation were not performed.     ABNORMALITY - Continuous slow, generalized - Excessive beta, generalized  IMPRESSION: This study is suggestive of mild generalized cerebral dysfunction. The excessive beta activity seen in the background is most likely due to the effect of benzodiazepine and is a benign EEG pattern.  No seizures or epileptiform discharges were seen during the study.  Aliviya Schoeller O Mija Effertz

## 2024-06-06 NOTE — Progress Notes (Signed)
 PROGRESS NOTE    Tanya Maldonado  FMW:[id number] DOB: [birth year] DOA: 06/04/2024 PCP: Pcp, No   Brief Narrative:   23 year old female with history of polysubstance abuse with recreational substances including fentanyl , methamphetamine and alcohol abuse. She is currently incarcerated and was brought to the ED after she started seizing. She does not have a history of epilepsy. Her last use of alcohol fentanyl  and methamphetamine was about 3 days ago. She is a heavy alcohol consumer reported that she drinks half bottle of liquor to a case of beer per day. She had another seizure while in the ED. she is currently being managed for acute alcohol withdrawal seizures. Pending EEG   Assessment & Plan:  Principal Problem:   Alcohol withdrawal seizure (HCC) Active Problems:   Polysubstance abuse   Alcohol abuse   Fentanyl  use disorde   Methamphetamine abuse   Hypocalcemia   Sinus tachycardia   Acute alcohol withdrawal seizures, POA: -- last alcohol consumption 3 days prior to admission since she has been incarcerated -- she is high risk for DTs -- continue CIWA and lorazepam as needed -- Continue with scheduled librium taper  -- seizure precautions, neuro checks --Pending EEG   Hypocalcemia -- As needed repletion  Headache: Ordered prn IV Toradol    Sinus tachycardia.  Likely in the setting of amphetamine use -- treating supportively and as above   Polysubstance abuse --Positive for THC, fentanyl  as well as amphetamines -- TOC consultation for substance abuse resources -- Counseled extensively regarding cessation of any illicit drugs  Disposition: Back to jail   DVT prophylaxis: enoxaparin (LOVENOX) injection 40 mg Start: 06/04/24 2200     Code Status: Full Code Family Communication: No family member At the bedside.  Officer was present at the bedside Status is: Inpt    Subjective:  Only complaint is headache.  We spoke about EEG to be done today.  She did have few  episodes of seizure-like activity yesterday but it does not appear to be true seizures as there was no postictal   and patient was fairly awake during these episodes.   Examination:  General exam: Appears calm and comfortable  Respiratory system: Clear to auscultation. Respiratory effort normal. Cardiovascular system: S1 & S2 heard, RRR. No JVD, murmurs, rubs, gallops or clicks. No pedal edema. Gastrointestinal system: Abdomen is nondistended, soft and nontender. No organomegaly or masses felt. Normal bowel sounds heard. Central nervous system: Alert and oriented. No focal neurological deficits. Extremities: Symmetric 5 x 5 power. Skin: No rashes, lesions or ulcers    Diet Orders (From admission, onward)     Start     Ordered   06/04/24 1043  Diet regular Room service appropriate? Yes; Fluid consistency: Thin  Diet effective now       Question Answer Comment  Room service appropriate? Yes   Fluid consistency: Thin      06/04/24 1046            Objective: Vitals:   06/05/24 2014 06/06/24 0312 06/06/24 0610 06/06/24 0737  BP: 113/74 113/77 107/71 111/77  Pulse:  95 89 74  Resp: 16 18    Temp: 98 F (36.7 C) 98.3 F (36.8 C) 98.4 F (36.9 C) 98 F (36.7 C)  TempSrc: Oral Oral Oral Oral  SpO2: 99% 100% 100% 98%  Weight:      Height:        Intake/Output Summary (Last 24 hours) at 06/06/2024 0834 Last data filed at 06/05/2024 1300 Gross per 24 hour  Intake 480 ml  Output --  Net 480 ml   Filed Weights   06/04/24 0641 06/04/24 1116  Weight: 59 kg 63.4 kg    Scheduled Meds:  chlordiazePOXIDE  10 mg Oral TID   Followed by   NOREEN ON 06/08/2024] chlordiazePOXIDE  5 mg Oral TID   enoxaparin (LOVENOX) injection  40 mg Subcutaneous Q24H   folic acid  1 mg Oral Daily   ketorolac   15 mg Intravenous Once   multivitamin with minerals  1 tablet Oral Daily   pantoprazole  40 mg Oral QPM   thiamine  100 mg Oral Daily   Or   thiamine  100 mg Intravenous Daily    Continuous Infusions:    Nutritional status     Body mass index is 24.75 kg/m.  Data Reviewed:   CBC: Recent Labs  Lab 06/04/24 0744  WBC 6.6  NEUTROABS 4.2  HGB 13.8  HCT 41.3  MCV 93.7  PLT 397   Basic Metabolic Panel: Recent Labs  Lab 06/04/24 0744 06/05/24 0536  NA 139 140  K 3.6 3.7  CL 106 105  CO2 23 26  GLUCOSE 88 120*  BUN 8 9  CREATININE 0.69 0.82  CALCIUM 8.4* 9.1  MG  --  2.1  PHOS  --  2.8   GFR: Estimated Creatinine Clearance: 97.3 mL/min (by C-G formula based on SCr of 0.82 mg/dL). Liver Function Tests: Recent Labs  Lab 06/04/24 0744 06/05/24 0536  AST 12* 14*  ALT 10 8  ALKPHOS 66 71  BILITOT 0.4 0.3  PROT 6.5 6.7  ALBUMIN 4.0 4.0   Recent Labs  Lab 06/04/24 0744  LIPASE 33   No results for input(s): AMMONIA in the last 168 hours. Coagulation Profile: No results for input(s): INR, PROTIME in the last 168 hours. Cardiac Enzymes: No results for input(s): CKTOTAL, CKMB, CKMBINDEX, TROPONINI in the last 168 hours. BNP (last 3 results) No results for input(s): PROBNP in the last 8760 hours. HbA1C: No results for input(s): HGBA1C in the last 72 hours. CBG: No results for input(s): GLUCAP in the last 168 hours. Lipid Profile: No results for input(s): CHOL, HDL, LDLCALC, TRIG, CHOLHDL, LDLDIRECT in the last 72 hours. Thyroid  Function Tests: No results for input(s): TSH, T4TOTAL, FREET4, T3FREE, THYROIDAB in the last 72 hours. Anemia Panel: No results for input(s): VITAMINB12, FOLATE, FERRITIN, TIBC, IRON, RETICCTPCT in the last 72 hours. Sepsis Labs: No results for input(s): PROCALCITON, LATICACIDVEN in the last 168 hours.  No results found for this or any previous visit (from the past 240 hours).       Radiology Studies: No results found.       LOS: 1 day   Time spent= 37 mins    Deliliah Room, MD [facility]  If 7PM-7AM, please contact  night-coverage  06/06/2024, 8:34 AM

## 2024-06-06 NOTE — Progress Notes (Signed)
 Routine EEG complete. Results pending.

## 2024-06-06 NOTE — Progress Notes (Signed)
 Pt had better day today. CIWA remains 5-8 most of the day but cooperative and only one shaking episode earlier this am after sheriff's deputy told her she could not use the phone for personal calls. Given ativan once today which was effective at controlling her anxiety and tremors. Pt has complained of headache and left/center chest soreness with deep breath or arm movement, pain controlled with tylenol  and oxycodone. VSS have remained stable, pt has been OOB to bathroom and did take a shower today. Pt stated this afternoon that she is feeling better today, affect pleasant. EEG completed this afternoon per order. Guard remains at bedside, pt's ankles remain shackled to bed. No s/s skin breakdown or irritation noted.

## 2024-06-07 ENCOUNTER — Ambulatory Visit (HOSPITAL_COMMUNITY)
Admission: EM | Admit: 2024-06-07 | Discharge: 2024-06-07 | Disposition: A | Discharge status: Home / Self Care | Source: Ambulatory Visit | Attending: Emergency Medicine | Admitting: Emergency Medicine

## 2024-06-07 ENCOUNTER — Other Ambulatory Visit (HOSPITAL_COMMUNITY): Payer: Self-pay

## 2024-06-07 DIAGNOSIS — F10939 Alcohol use, unspecified with withdrawal, unspecified: Secondary | ICD-10-CM | POA: Diagnosis not present

## 2024-06-07 DIAGNOSIS — Z0441 Encounter for examination and observation following alleged adult rape: Secondary | ICD-10-CM | POA: Insufficient documentation

## 2024-06-07 DIAGNOSIS — R569 Unspecified convulsions: Secondary | ICD-10-CM | POA: Diagnosis not present

## 2024-06-07 LAB — HEPATITIS C ANTIBODY: HCV Ab: NONREACTIVE

## 2024-06-07 LAB — RAPID HIV SCREEN (HIV 1/2 AB+AG)
HIV 1/2 Antibodies: NONREACTIVE
HIV-1 P24 Antigen - HIV24: NONREACTIVE

## 2024-06-07 LAB — HEPATITIS B SURFACE ANTIGEN: Hepatitis B Surface Ag: NONREACTIVE

## 2024-06-07 MED ORDER — BICTEGRAVIR-EMTRICITAB-TENOFOV 50-200-25 MG PO PREPACK
1.0000 | ORAL_TABLET | Freq: Every day | ORAL | Status: DC
  Filled 2024-06-07: qty 1

## 2024-06-07 MED ORDER — LIDOCAINE HCL (PF) 1 % IJ SOLN
1.0000 mL | Freq: Once | INTRAMUSCULAR | Status: AC
  Administered 2024-06-07: 1 mL

## 2024-06-07 MED ORDER — CEFTRIAXONE SODIUM 500 MG IJ SOLR
500.0000 mg | Freq: Once | INTRAMUSCULAR | Status: AC
Start: 2024-06-07 — End: 2024-06-07
  Administered 2024-06-07: 500 mg via INTRAMUSCULAR
  Filled 2024-06-07: qty 500

## 2024-06-07 MED ORDER — AZITHROMYCIN 250 MG PO TABS
1000.0000 mg | ORAL_TABLET | Freq: Once | ORAL | Status: AC
  Administered 2024-06-07: 1000 mg via ORAL
  Filled 2024-06-07: qty 4

## 2024-06-07 MED ORDER — BICTEGRAVIR-EMTRICITAB-TENOFOV 50-200-25 MG PO TABS
1.0000 | ORAL_TABLET | Freq: Every day | ORAL | Status: DC
  Filled 2024-06-07 (×2): qty 1

## 2024-06-07 MED ORDER — BICTEGRAVIR-EMTRICITAB-TENOFOV 50-200-25 MG PO PREPACK
1.0000 | ORAL_TABLET | Freq: Every day | ORAL | Status: DC
  Administered 2024-06-07: 1 via ORAL
  Filled 2024-06-07: qty 1

## 2024-06-07 MED ORDER — METRONIDAZOLE 500 MG PO TABS
2000.0000 mg | ORAL_TABLET | Freq: Once | ORAL | Status: AC
Start: 2024-06-07 — End: 2024-06-07
  Administered 2024-06-07: 2000 mg via ORAL
  Filled 2024-06-07: qty 4
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
## 2024-06-07 NOTE — Progress Notes (Signed)
 Report called to Manufacturing Systems Engineer at [facility], room [phone]. Updated officer that patient would be transferring when [facility] arrived.

## 2024-06-07 NOTE — Plan of Care (Signed)

## 2024-06-07 NOTE — Progress Notes (Addendum)
 PROGRESS NOTE    Tanya Maldonado  FMW:[id number] DOB: 2001-04-26 DOA: 06/04/2024 PCP: Pcp, No   Brief Narrative:   23 year old female with history of polysubstance abuse with recreational substances including fentanyl , methamphetamine and alcohol abuse. She is currently incarcerated and was brought to the ED after she started seizing. She does not have a history of epilepsy. Her last use of alcohol fentanyl  and methamphetamine was about 3 days ago. She is a heavy alcohol consumer reported that she drinks half bottle of liquor to a case of beer per day. She had another seizure while in the ED. she is currently being managed for acute alcohol withdrawal seizures. EEG done on 06/06/2024 did not show any epileptiform movements.  Case discussed with Dr. Shelton, neurologist and recommendation is to transfer to [facility] for long-term EEG monitoring to rule out pseudoseizures/nonepileptiform activity.   Assessment & Plan:  Principal Problem:   Alcohol withdrawal seizure (HCC) Active Problems:   Polysubstance abuse   Alcohol abuse   Fentanyl  use disorde   Methamphetamine abuse   Hypocalcemia   Sinus tachycardia   Acute alcohol withdrawal seizures, POA: -- last alcohol consumption 3 days prior to admission since she has been incarcerated -- she is high risk for DTs -- continue CIWA and lorazepam as needed -- Continue with scheduled librium taper  -- seizure precautions, neuro checks -- EEG done on 06/06/2024 which did not show any epileptiform movements.  Did show mild generalized cerebral dysfunction -Discussed with neurologist, Dr. Shelton and recommendation is to transfer to [facility] for long-term monitoring to rule out pseudoseizures/nonepileptiform activity. - No need to initiate antiepileptic drugs at this time as per Dr. Shelton.   Hypocalcemia -- As needed repletion  Headache: Resolved with Toradol    Sinus tachycardia.  Likely in the setting of amphetamine use --  treating supportively and as above   Polysubstance abuse --Positive for THC, fentanyl  as well as amphetamines -- TOC consultation for substance abuse resources -- Counseled extensively regarding cessation of any illicit drugs  Disposition: Back to jail, once discharged from the hospital   DVT prophylaxis: enoxaparin (LOVENOX) injection 40 mg Start: 06/04/24 2200     Code Status: Full Code Family Communication: No family member At the bedside.  Officer was present at the bedside Status is: Inpt    Subjective:  She denied any active complaints morning.  As per patient's nurse's note, she had full body jerking movement this morning that lasted for about 17 seconds but patient was not incontinent or postictal.  She was nauseous right after the episode.  She was given 1 mg of Ativan and 4 mg of Zofran . She had a similar episode yesterday morning which lasted for about 30 seconds.   Examination:  General exam: Appears calm and comfortable  Respiratory system: Clear to auscultation. Respiratory effort normal. Cardiovascular system: S1 & S2 heard, RRR. No JVD, murmurs, rubs, gallops or clicks. No pedal edema. Gastrointestinal system: Abdomen is nondistended, soft and nontender. No organomegaly or masses felt. Normal bowel sounds heard. Central nervous system: Alert and oriented. No focal neurological deficits. Extremities: Symmetric 5 x 5 power. Skin: No rashes, lesions or ulcers    Diet Orders (From admission, onward)     Start     Ordered   06/04/24 1043  Diet regular Room service appropriate? Yes; Fluid consistency: Thin  Diet effective now       Question Answer Comment  Room service appropriate? Yes   Fluid consistency: Thin  06/04/24 1046            Objective: Vitals:   06/06/24 1327 06/06/24 2213 06/07/24 0412 06/07/24 0447  BP: 122/86 100/63 108/73 102/70  Pulse: 94 84 82 66  Resp:  18 16   Temp:  97.6 F (36.4 C) 98.1 F (36.7 C)   TempSrc:  Oral Oral    SpO2: 100% 99% 97%   Weight:      Height:       No intake or output data in the 24 hours ending 06/07/24 1104  Filed Weights   06/04/24 0641 06/04/24 1116  Weight: 59 kg 63.4 kg    Scheduled Meds:  chlordiazePOXIDE  10 mg Oral TID   Followed by   NOREEN ON 06/08/2024] chlordiazePOXIDE  5 mg Oral TID   enoxaparin (LOVENOX) injection  40 mg Subcutaneous Q24H   folic acid  1 mg Oral Daily   multivitamin with minerals  1 tablet Oral Daily   pantoprazole  40 mg Oral QPM   thiamine  100 mg Oral Daily   Or   thiamine  100 mg Intravenous Daily   Continuous Infusions:    Nutritional status     Body mass index is 24.75 kg/m.  Data Reviewed:   CBC: Recent Labs  Lab 06/04/24 0744  WBC 6.6  NEUTROABS 4.2  HGB 13.8  HCT 41.3  MCV 93.7  PLT 397   Basic Metabolic Panel: Recent Labs  Lab 06/04/24 0744 06/05/24 0536  NA 139 140  K 3.6 3.7  CL 106 105  CO2 23 26  GLUCOSE 88 120*  BUN 8 9  CREATININE 0.69 0.82  CALCIUM 8.4* 9.1  MG  --  2.1  PHOS  --  2.8   GFR: Estimated Creatinine Clearance: 97.3 mL/min (by C-G formula based on SCr of 0.82 mg/dL). Liver Function Tests: Recent Labs  Lab 06/04/24 0744 06/05/24 0536  AST 12* 14*  ALT 10 8  ALKPHOS 66 71  BILITOT 0.4 0.3  PROT 6.5 6.7  ALBUMIN 4.0 4.0   Recent Labs  Lab 06/04/24 0744  LIPASE 33   No results for input(s): AMMONIA in the last 168 hours. Coagulation Profile: No results for input(s): INR, PROTIME in the last 168 hours. Cardiac Enzymes: No results for input(s): CKTOTAL, CKMB, CKMBINDEX, TROPONINI in the last 168 hours. BNP (last 3 results) No results for input(s): PROBNP in the last 8760 hours. HbA1C: No results for input(s): HGBA1C in the last 72 hours. CBG: No results for input(s): GLUCAP in the last 168 hours. Lipid Profile: No results for input(s): CHOL, HDL, LDLCALC, TRIG, CHOLHDL, LDLDIRECT in the last 72 hours. Thyroid  Function Tests: No  results for input(s): TSH, T4TOTAL, FREET4, T3FREE, THYROIDAB in the last 72 hours. Anemia Panel: No results for input(s): VITAMINB12, FOLATE, FERRITIN, TIBC, IRON, RETICCTPCT in the last 72 hours. Sepsis Labs: No results for input(s): PROCALCITON, LATICACIDVEN in the last 168 hours.  No results found for this or any previous visit (from the past 240 hours).       Radiology Studies: EEG adult Result Date: 06/06/2024 [name], MD     06/06/2024  5:03 PM Patient Name: Tanya Maldonado MRN: [id number] Epilepsy Attending: Arlin KIDD Shelton Referring Physician/Provider: Vicci Afton CROME, MD Date: 06/06/2024 Duration: 22.39 mins Patient history: 23 year old female with alcohol withdrawal seizure.  EEG to seizure. Level of alertness: Awake AEDs during EEG study: Librium Technical aspects: This EEG study was done with scalp electrodes positioned according to the 10-20  International system of electrode placement. Electrical activity was reviewed with band pass filter of 1-70Hz , sensitivity of 7 uV/mm, display speed of 23mm/sec with a 60Hz  notched filter applied as appropriate. EEG data were recorded continuously and digitally stored.  Video monitoring was available and reviewed as appropriate. Description: EEG showed continuous generalized 5 to 6 Hz delta slowing admixed with 13-15 Hz beta activity.  Hyperventilation and photic stimulation were not performed.   ABNORMALITY - Continuous slow, generalized - Excessive beta, generalized IMPRESSION: This study is suggestive of mild generalized cerebral dysfunction. The excessive beta activity seen in the background is most likely due to the effect of benzodiazepine and is a benign EEG pattern.  No seizures or epileptiform discharges were seen during the study. Tanya Maldonado         LOS: 2 days   Time spent= 37 mins    Deliliah Room, MD [facility]  If 7PM-7AM, please contact night-coverage  06/07/2024,  11:04 AM

## 2024-06-07 NOTE — SANE Note (Signed)
 On 06/07/2024, at approximately 1720 hours, the SANE/FNE Teacher, Music) consult was completed. The primary RN and provider have been notified. Please contact the SANE/FNE nurse on call (listed in Amion) with any further concerns.
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
## 2024-06-07 NOTE — Plan of Care (Signed)
  Problem: Education: Goal: Knowledge of General Education information will improve Description: Including pain rating scale, medication(s)/side effects and non-pharmacologic comfort measures Outcome: Not Progressing   Problem: Health Behavior/Discharge Planning: Goal: Ability to manage health-related needs will improve Outcome: Not Progressing   Problem: Clinical Measurements: Goal: Ability to maintain clinical measurements within normal limits will improve Outcome: Not Progressing Goal: Will remain free from infection Outcome: Not Progressing Goal: Diagnostic test results will improve Outcome: Not Progressing Goal: Respiratory complications will improve Outcome: Not Progressing Goal: Cardiovascular complication will be avoided Outcome: Not Progressing   Problem: Activity: Goal: Risk for activity intolerance will decrease Outcome: Not Progressing   Problem: Pain Managment: Goal: General experience of comfort will improve and/or be controlled Outcome: Not Progressing   Problem: Safety: Goal: Ability to remain free from injury will improve Outcome: Not Progressing   Problem: Skin Integrity: Goal: Risk for impaired skin integrity will decrease Outcome: Not Progressing
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
## 2024-06-07 NOTE — Progress Notes (Signed)
 Pt with full body jerking movement x17s. Pt not incontinent, pt not post-ictal. Pt nauseas and vomiting immediately upon coming out of episode. 1mg  ativan + 4mg  zofran  admin per order. MD notified.

## 2024-06-07 NOTE — Plan of Care (Signed)

## 2024-06-07 NOTE — SANE Note (Signed)
 The SANE/FNE RN should be in to see the patient in approximately 1.00-1.50 hours.  If possible, please defer any showers, baths, and/or genital care until the SANE/FNE RN can speak with the patient.

## 2024-06-08 ENCOUNTER — Other Ambulatory Visit (HOSPITAL_COMMUNITY): Payer: Self-pay

## 2024-06-08 DIAGNOSIS — R569 Unspecified convulsions: Secondary | ICD-10-CM | POA: Diagnosis not present

## 2024-06-08 DIAGNOSIS — F10939 Alcohol use, unspecified with withdrawal, unspecified: Secondary | ICD-10-CM | POA: Diagnosis not present

## 2024-06-08 LAB — BASIC METABOLIC PANEL WITH GFR
Anion gap: 11 (ref 5–15)
BUN: 12 mg/dL (ref 6–20)
CO2: 19 mmol/L — ABNORMAL LOW (ref 22–32)
Calcium: 8.6 mg/dL — ABNORMAL LOW (ref 8.9–10.3)
Chloride: 106 mmol/L (ref 98–111)
Creatinine, Ser: 0.93 mg/dL (ref 0.44–1.00)
GFR, Estimated: >60 mL/min (ref >60)
Glucose, Bld: 108 mg/dL — ABNORMAL HIGH (ref 70–99)
Potassium: 3.8 mmol/L (ref 3.5–5.1)
Sodium: 136 mmol/L (ref 135–145)

## 2024-06-08 LAB — MAGNESIUM: Magnesium: 1.9 mg/dL (ref 1.7–2.4)

## 2024-06-08 LAB — CBC
HCT: 39.8 % (ref 36.0–46.0)
Hemoglobin: 13.4 g/dL (ref 12.0–15.0)
MCH: 30.9 pg (ref 26.0–34.0)
MCHC: 33.7 g/dL (ref 30.0–36.0)
MCV: 91.9 fL (ref 80.0–100.0)
Platelets: 383 K/uL (ref 150–400)
RBC: 4.33 MIL/uL (ref 3.87–5.11)
RDW: 13.1 % (ref 11.5–15.5)
WBC: 10 K/uL (ref 4.0–10.5)
nRBC: 0 % (ref 0.0–0.2)

## 2024-06-08 LAB — RPR: RPR Ser Ql: NONREACTIVE

## 2024-06-08 LAB — PHOSPHORUS: Phosphorus: 3.6 mg/dL (ref 2.5–4.6)

## 2024-06-08 MED ORDER — BICTEGRAVIR-EMTRICITAB-TENOFOV 50-200-25 MG PO TABS
1.0000 | ORAL_TABLET | Freq: Every day | ORAL | 0 refills | Status: AC
  Filled 2024-06-08: qty 30, 30d supply, fill #0

## 2024-06-08 MED ORDER — BICTEGRAVIR-EMTRICITAB-TENOFOV 50-200-25 MG PO TABS
1.0000 | ORAL_TABLET | Freq: Every day | ORAL | Status: DC
  Administered 2024-06-08 – 2024-06-09 (×2): 1 via ORAL
  Filled 2024-06-08 (×2): qty 1

## 2024-06-08 NOTE — Progress Notes (Signed)
 Pharmacy note -   Biktarvy prescription sent to [facility] pharmacy - 30 days supply with no refills.  Venetia Gully, PharmD, [facility], [facility] Clinical Pharmacist Phone [phone]

## 2024-06-08 NOTE — Progress Notes (Signed)
 LTM EEG hooked up and running - no initial skin breakdown - push button tested - [facility] monitoring.
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
## 2024-06-08 NOTE — Progress Notes (Addendum)
 Pt remains in bed with [facility] outside of door in the hallway.  Resting in bed, asking for lunch.  LTEEG leads intact.

## 2024-06-08 NOTE — Progress Notes (Signed)
 Pt resting in bed c/o headache.  Tylenol  given per pt request.  [facility] 2nd shift officer sitting inside room.  LTEEG leads intact.

## 2024-06-08 NOTE — TOC Progression Note (Signed)
 Transition of Care [facility]) - Progression Note    Patient Details  Name: Tanya Maldonado MRN: [id number] Date of Birth: 2001-04-25  [facility] CM/SW Contact  Landry DELENA Senters, RN Phone Number: 06/08/2024, 10:25 AM  Clinical Narrative:     Patient was placed on LT EEG. Plan is to return to jail when discharged.   CM will continue to follow.     Barriers to Discharge: No Barriers Identified               Expected Discharge Plan and Services                                               Social Drivers of Health (SDOH) Interventions SDOH Screenings   Food Insecurity: Food Insecurity Present (06/04/2024)  Housing: High Risk (06/04/2024)  Transportation Needs: Unmet Transportation Needs (06/04/2024)  Utilities: At Risk (06/04/2024)  Depression (PHQ2-9): Low Risk  (01/17/2019)  Tobacco Use: Medium Risk (06/04/2024)    Readmission Risk Interventions    06/06/2024    9:23 AM  Readmission Risk Prevention Plan  Medication Screening Complete  Transportation Screening Complete
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
## 2024-06-09 ENCOUNTER — Other Ambulatory Visit (HOSPITAL_COMMUNITY): Payer: Self-pay

## 2024-06-09 ENCOUNTER — Inpatient Hospital Stay (HOSPITAL_COMMUNITY)
Admit: 2024-06-09 | Discharge: 2024-06-09 | Disposition: A | Discharge status: Home / Self Care | Attending: Neurology | Admitting: Neurology

## 2024-06-09 DIAGNOSIS — F10939 Alcohol use, unspecified with withdrawal, unspecified: Secondary | ICD-10-CM | POA: Diagnosis not present

## 2024-06-09 DIAGNOSIS — R569 Unspecified convulsions: Secondary | ICD-10-CM | POA: Diagnosis not present

## 2024-06-09 LAB — CBC
HCT: 41.8 % (ref 36.0–46.0)
Hemoglobin: 14.2 g/dL (ref 12.0–15.0)
MCH: 31.2 pg (ref 26.0–34.0)
MCHC: 34 g/dL (ref 30.0–36.0)
MCV: 91.9 fL (ref 80.0–100.0)
Platelets: 421 K/uL — ABNORMAL HIGH (ref 150–400)
RBC: 4.55 MIL/uL (ref 3.87–5.11)
RDW: 13.2 % (ref 11.5–15.5)
WBC: 9.8 K/uL (ref 4.0–10.5)
nRBC: 0 % (ref 0.0–0.2)

## 2024-06-09 LAB — BASIC METABOLIC PANEL WITH GFR
Anion gap: 9 (ref 5–15)
BUN: 13 mg/dL (ref 6–20)
CO2: 22 mmol/L (ref 22–32)
Calcium: 9 mg/dL (ref 8.9–10.3)
Chloride: 103 mmol/L (ref 98–111)
Creatinine, Ser: 0.97 mg/dL (ref 0.44–1.00)
GFR, Estimated: >60 mL/min (ref >60)
Glucose, Bld: 102 mg/dL — ABNORMAL HIGH (ref 70–99)
Potassium: 4.4 mmol/L (ref 3.5–5.1)
Sodium: 134 mmol/L — ABNORMAL LOW (ref 135–145)

## 2024-06-09 LAB — MAGNESIUM: Magnesium: 1.9 mg/dL (ref 1.7–2.4)

## 2024-06-09 MED ORDER — VITAMIN B-1 100 MG PO TABS: 100.0000 mg | ORAL_TABLET | Freq: Every day | ORAL | Status: AC

## 2024-06-09 MED ORDER — ADULT MULTIVITAMIN W/MINERALS CH: 1.0000 | ORAL_TABLET | Freq: Every day | ORAL | Status: AC

## 2024-06-09 MED ORDER — NICOTINE 21 MG/24HR TD PT24: 21.0000 mg | MEDICATED_PATCH | Freq: Every day | TRANSDERMAL | Status: AC | PRN

## 2024-06-09 MED ORDER — FOLIC ACID 1 MG PO TABS: 1.0000 mg | ORAL_TABLET | Freq: Every day | ORAL | Status: AC

## 2024-06-09 MED ORDER — TRAZODONE HCL 50 MG PO TABS: 25.0000 mg | ORAL_TABLET | Freq: Every evening | ORAL | Status: AC | PRN

## 2024-06-09 NOTE — Progress Notes (Signed)
 Subjective: Tanya Maldonado. No new concerns  ROS: negative except above  Examination  Vital signs in last 24 hours: Temp:  [97.7 F (36.5 C)-98.7 F (37.1 C)] 97.7 F (36.5 C) (11/13 0440) Pulse Rate:  [78-110] 104 (11/13 0440) Resp:  [16-19] 17 (11/13 0440) BP: (92-112)/(62-76) 102/68 (11/13 0440) SpO2:  [98 %-99 %] 98 % (11/13 0440)  General: lying in bed, NAD Neuro: AO x 3, cranial nerves grossly intact, 5/5 in all 4 extremities, sensory intact to light touch, FTN intact bilaterally   Basic Metabolic Panel: Recent Labs  Lab 06/04/24 0744 06/05/24 0536 06/08/24 0839 06/09/24 0430  NA 139 140 136 134*  K 3.6 3.7 3.8 4.4  CL 106 105 106 103  CO2 23 26 19* 22  GLUCOSE 88 120* 108* 102*  BUN 8 9 12 13   CREATININE 0.69 0.82 0.93 0.97  CALCIUM 8.4* 9.1 8.6* 9.0  MG  --  2.1 1.9 1.9  PHOS  --  2.8 3.6  --     CBC: Recent Labs  Lab 06/04/24 0744 06/08/24 0839 06/09/24 0430  WBC 6.6 10.0 9.8  NEUTROABS 4.2  --   --   HGB 13.8 13.4 14.2  HCT 41.3 39.8 41.8  MCV 93.7 91.9 91.9  PLT 397 383 421*     Coagulation Studies: No results for input(s): LABPROT, INR in the last 72 hours.  Imaging No new brain imaging  ASSESSMENT AND PLAN:23 year old female brought in from jail due to seizure-like activity in the setting of substance abuse as well as alcohol withdrawal.   Seizure-like activity - Likely alcohol withdrawal seizures vs provoked seizures in the setting of substance use versus nonepileptic spells in a patient currently incarcerated   Recommendations: - No events, no ictal-interictal abnormality on eeg. Given no h/o seizures in past and clear provoking factors now, will not start anti seizure meds.  - DC LTM eeg - Counseled patient against alcohol use and substance use - Discussed plan with hospitalist via secure chat   Seizure precautions: Per [place]  DM[place] statutes, patients with seizures are not allowed to drive until they have been seizure-free for  six months and cleared by a physician    Use caution when using heavy equipment or power tools. Avoid working on ladders or at heights. Take showers instead of baths. Ensure the water temperature is not too high on the home water heater. Do not go swimming alone. Do not lock yourself in a room alone (i.e. bathroom). When caring for infants or small children, sit down when holding, feeding, or changing them to minimize risk of injury to the child in the event you have a seizure. Maintain good sleep hygiene. Avoid alcohol.    If patient has another seizure, call 911 and bring them back to the ED if: A.  The seizure lasts longer than 5 minutes.      B.  The patient doesn't wake shortly after the seizure or has new problems such as difficulty seeing, speaking or moving following the seizure C.  The patient was injured during the seizure D.  The patient has a temperature over 102 F (39C) E.  The patient vomited during the seizure and now is having trouble breathing    During the Seizure   - First, ensure adequate ventilation and place patients on the floor on their left side  Loosen clothing around the neck and ensure the airway is patent. If the patient is clenching the teeth, do not force the mouth open with  any object as this can cause severe damage - Remove all items from the surrounding that can be hazardous. The patient may be oblivious to what's happening and may not even know what he or she is doing. If the patient is confused and wandering, either gently guide him/her away and block access to outside areas - Reassure the individual and be comforting - Call 911. In most cases, the seizure ends before EMS arrives. However, there are cases when seizures may last over 3 to 5 minutes. Or the individual may have developed breathing difficulties or severe injuries. If a pregnant patient or a person with diabetes develops a seizure, it is prudent to call an ambulance.     After the Seizure  (Postictal Stage)   After a seizure, most patients experience confusion, fatigue, muscle pain and/or a headache. Thus, one should permit the individual to sleep. For the next few days, reassurance is essential. Being calm and helping reorient the person is also of importance.   Most seizures are painless and end spontaneously. Seizures are not harmful to others but can lead to complications such as stress on the lungs, brain and the heart. Individuals with prior lung problems may develop labored breathing and respiratory distress.       I personally spent a total of  36 minutes in the care of the patient today including getting/reviewing separately obtained history, performing a medically appropriate exam/evaluation, counseling and educating, placing orders, referring and communicating with other health care professionals, documenting clinical information in the EHR, independently interpreting results, and coordinating care.            Arlin Krebs Epilepsy [facility] For questions after 5pm please refer to AMION to reach the Neurologist on call

## 2024-06-09 NOTE — TOC Transition Note (Signed)
 Transition of Care [facility]) - Discharge Note   Patient Details  Name: Tanya Maldonado MRN: [id number] Date of Birth: 2001-06-07  Transition of [facility]) CM/SW Contact:  Andrez JULIANNA George, RN Phone Number: 06/09/2024, 10:59 AM   Clinical Narrative:     Pt is discharging back to [facility] corrections facility. Guard at the bedside to provide needed transportation. CM called and updated medical at the jail. Will fax needed documents to: [phone]  Final next level of care: Corrections Facility Barriers to Discharge: No Barriers Identified   Patient Goals and CMS Choice            Discharge Placement                       Discharge Plan and Services Additional resources added to the After Visit Summary for                                       Social Drivers of Health (SDOH) Interventions SDOH Screenings   Food Insecurity: Food Insecurity Present (06/04/2024)  Housing: High Risk (06/04/2024)  Transportation Needs: Unmet Transportation Needs (06/04/2024)  Utilities: At Risk (06/04/2024)  Depression (PHQ2-9): Low Risk  (01/17/2019)  Tobacco Use: Medium Risk (06/04/2024)     Readmission Risk Interventions    06/06/2024    9:23 AM  Readmission Risk Prevention Plan  Medication Screening Complete  Transportation Screening Complete
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
## 2024-06-09 NOTE — Progress Notes (Signed)
 Patient provided discharge instructions along with deputy.

## 2024-06-09 NOTE — Plan of Care (Signed)
  Problem: Education: Goal: Knowledge of General Education information will improve Description: Including pain rating scale, medication(s)/side effects and non-pharmacologic comfort measures 06/09/2024 1000 by Kermit Erby NOVAK, RN Outcome: Adequate for Discharge 06/09/2024 1000 by Kermit Erby NOVAK, RN Outcome: Adequate for Discharge   Problem: Health Behavior/Discharge Planning: Goal: Ability to manage health-related needs will improve 06/09/2024 1000 by Kermit Erby NOVAK, RN Outcome: Adequate for Discharge 06/09/2024 1000 by Kermit Erby NOVAK, RN Outcome: Adequate for Discharge   Problem: Clinical Measurements: Goal: Ability to maintain clinical measurements within normal limits will improve 06/09/2024 1000 by Kermit Erby NOVAK, RN Outcome: Adequate for Discharge 06/09/2024 1000 by Kermit Erby NOVAK, RN Outcome: Adequate for Discharge Goal: Will remain free from infection 06/09/2024 1000 by Kermit Erby NOVAK, RN Outcome: Adequate for Discharge 06/09/2024 1000 by Kermit Erby NOVAK, RN Outcome: Adequate for Discharge Goal: Diagnostic test results will improve 06/09/2024 1000 by Kermit Erby NOVAK, RN Outcome: Adequate for Discharge 06/09/2024 1000 by Kermit Erby NOVAK, RN Outcome: Adequate for Discharge Goal: Respiratory complications will improve 06/09/2024 1000 by Kermit Erby NOVAK, RN Outcome: Adequate for Discharge 06/09/2024 1000 by Kermit Erby NOVAK, RN Outcome: Adequate for Discharge Goal: Cardiovascular complication will be avoided 06/09/2024 1000 by Kermit Erby NOVAK, RN Outcome: Adequate for Discharge 06/09/2024 1000 by Kermit Erby NOVAK, RN Outcome: Adequate for Discharge   Problem: Activity: Goal: Risk for activity intolerance will decrease 06/09/2024 1000 by Kermit Erby NOVAK, RN Outcome: Adequate for Discharge 06/09/2024 1000 by Kermit Erby NOVAK, RN Outcome: Adequate for Discharge   Problem: Nutrition: Goal: Adequate  nutrition will be maintained 06/09/2024 1000 by Kermit Erby NOVAK, RN Outcome: Adequate for Discharge 06/09/2024 1000 by Kermit Erby NOVAK, RN Outcome: Adequate for Discharge   Problem: Coping: Goal: Level of anxiety will decrease 06/09/2024 1000 by Kermit Erby NOVAK, RN Outcome: Adequate for Discharge 06/09/2024 1000 by Kermit Erby NOVAK, RN Outcome: Adequate for Discharge   Problem: Elimination: Goal: Will not experience complications related to bowel motility 06/09/2024 1000 by Kermit Erby NOVAK, RN Outcome: Adequate for Discharge 06/09/2024 1000 by Kermit Erby NOVAK, RN Outcome: Adequate for Discharge Goal: Will not experience complications related to urinary retention 06/09/2024 1000 by Kermit Erby NOVAK, RN Outcome: Adequate for Discharge 06/09/2024 1000 by Kermit Erby NOVAK, RN Outcome: Adequate for Discharge   Problem: Pain Managment: Goal: General experience of comfort will improve and/or be controlled 06/09/2024 1000 by Kermit Erby NOVAK, RN Outcome: Adequate for Discharge 06/09/2024 1000 by Kermit Erby NOVAK, RN Outcome: Adequate for Discharge   Problem: Safety: Goal: Ability to remain free from injury will improve 06/09/2024 1000 by Kermit Erby NOVAK, RN Outcome: Adequate for Discharge 06/09/2024 1000 by Kermit Erby NOVAK, RN Outcome: Adequate for Discharge   Problem: Skin Integrity: Goal: Risk for impaired skin integrity will decrease 06/09/2024 1000 by Kermit Erby NOVAK, RN Outcome: Adequate for Discharge 06/09/2024 1000 by Kermit Erby NOVAK, RN Outcome: Adequate for Discharge

## 2024-06-09 NOTE — Procedures (Addendum)
 Patient Name: Tanya Maldonado  MRN: [id number]  Epilepsy Attending: Arlin MALVA Krebs  Referring Physician/Provider: Krebs Arlin MALVA, MD  Duration: 06/08/2024 9148 to 06/09/2024 9081   Patient history: 23 year old female with alcohol withdrawal seizure.  EEG to seizure.   Level of alertness: Awake, asleep   AEDs during EEG study:  None   Technical aspects: This EEG study was done with scalp electrodes positioned according to the 10-20 International system of electrode placement. Electrical activity was reviewed with band pass filter of 1-70Hz , sensitivity of 7 uV/mm, display speed of 51mm/sec with a 60Hz  notched filter applied as appropriate. EEG data were recorded continuously and digitally stored.  Video monitoring was available and reviewed as appropriate.   Description: The posterior dominant rhythm consists of 9Hz  activity of moderate voltage (25-35 uV) seen predominantly in posterior head regions, symmetric and reactive to eye opening and eye closing. Sleep was characterized by vertex waves, sleep spindles (12 to 14 Hz), maximal frontocentral region.  Hyperventilation did not show any EEG change.  Physiologic photic driving was seen during photic stimulation.   IMPRESSION: This study is within normal limits. No seizures or epileptiform discharges were seen throughout the recording.  A normal interictal EEG does not exclude the diagnosis of epilepsy.   Jodie Leiner [facility]

## 2024-06-10 ENCOUNTER — Other Ambulatory Visit (HOSPITAL_COMMUNITY): Payer: Self-pay
